# Patient Record
Sex: Female | Born: 1995 | Race: Black or African American | Hispanic: No | Marital: Single | Smoking: Current every day smoker
Health system: Southern US, Community
[De-identification: ages and names within clinical notes are randomized; demographics above are authoritative.]

## PROBLEM LIST (undated history)

## (undated) ENCOUNTER — Inpatient Hospital Stay (HOSPITAL_COMMUNITY): Payer: Self-pay

## (undated) DIAGNOSIS — Z789 Other specified health status: Secondary | ICD-10-CM

## (undated) DIAGNOSIS — B009 Herpesviral infection, unspecified: Secondary | ICD-10-CM

## (undated) HISTORY — PX: NO PAST SURGERIES: SHX2092

---
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
## 2014-03-02 ENCOUNTER — Inpatient Hospital Stay (HOSPITAL_COMMUNITY)
Admission: AD | Admit: 2014-03-02 | Discharge: 2014-03-02 | Disposition: A | Discharge status: Home / Self Care | Payer: PRIVATE HEALTH INSURANCE | Source: Ambulatory Visit | Attending: Obstetrics & Gynecology | Admitting: Obstetrics & Gynecology

## 2014-03-02 ENCOUNTER — Encounter (HOSPITAL_COMMUNITY): Payer: Self-pay | Admitting: *Deleted

## 2014-03-02 DIAGNOSIS — A499 Bacterial infection, unspecified: Secondary | ICD-10-CM | POA: Insufficient documentation

## 2014-03-02 DIAGNOSIS — B9689 Other specified bacterial agents as the cause of diseases classified elsewhere: Secondary | ICD-10-CM | POA: Insufficient documentation

## 2014-03-02 DIAGNOSIS — N760 Acute vaginitis: Secondary | ICD-10-CM | POA: Insufficient documentation

## 2014-03-02 DIAGNOSIS — T192XXA Foreign body in vulva and vagina, initial encounter: Secondary | ICD-10-CM | POA: Insufficient documentation

## 2014-03-02 DIAGNOSIS — IMO0002 Reserved for concepts with insufficient information to code with codable children: Secondary | ICD-10-CM | POA: Insufficient documentation

## 2014-03-02 HISTORY — DX: Other specified health status: Z78.9

## 2014-03-02 LAB — WET PREP, GENITAL
TRICH WET PREP: NONE SEEN
Yeast Wet Prep HPF POC: NONE SEEN

## 2014-03-02 LAB — POCT PREGNANCY, URINE: PREG TEST UR: NEGATIVE

## 2014-03-02 MED ORDER — METRONIDAZOLE 500 MG PO TABS: 500.0000 mg | ORAL_TABLET | Freq: Two times a day (BID) | ORAL | Status: DC

## 2014-03-02 NOTE — MAU Provider Note (Signed)
History     CSN: [id number]  Arrival date and time: 03/02/14 1150   First Merrin Mcvicker Initiated Contact with Patient 03/02/14 1442      Chief Complaint  Patient presents with  . Vaginal Discharge   HPI Comments: Anita Lundberg 18 y.o. G0P0 presents to MAU for STD screening and vaginal odor. No new partner x 3 years. Uses condoms 100% of time. LMP first week June.   Vaginal Discharge The patient's primary symptoms include a vaginal discharge.      Past Medical History  Diagnosis Date  . Medical history non-contributory     Past Surgical History  Procedure Laterality Date  . No past surgeries      Family History  Problem Relation Age of Onset  . Hypertension Mother   . Hypertension Father   . Allergies Sister     History  Substance Use Topics  . Smoking status: Never Smoker   . Smokeless tobacco: Never Used  . Alcohol Use: No    Allergies: No Known Allergies  No prescriptions prior to admission    Review of Systems  Constitutional: Negative.   HENT: Negative.   Eyes: Negative.   Respiratory: Negative.   Cardiovascular: Negative.   Genitourinary: Positive for vaginal discharge.       Vaginal odor and brownish discharge  Musculoskeletal: Negative.   Skin: Negative.   Neurological: Negative.   Psychiatric/Behavioral: Negative.    Physical Exam   Blood pressure 116/69, pulse 78, temperature 98.8 F (37.1 C), temperature source Oral, resp. rate 16, height 5\' 2"  (1.575 m), weight 77.565 kg (171 lb), last menstrual period 02/09/2014, SpO2 97.00%.  Physical Exam  Constitutional: She is oriented to person, place, and time. She appears well-developed and well-nourished.  HENT:  Head: Normocephalic and atraumatic.  Eyes: Conjunctivae are normal. Pupils are equal, round, and reactive to light.  Cardiovascular: Normal rate and regular rhythm.   Respiratory: Effort normal and breath sounds normal.  GI: Soft. Bowel sounds are normal.  Genitourinary:   Genital:external odor Vaginal:tampon in vagina Cervix:closed/ thick Bimanual:nontender   Musculoskeletal: Normal range of motion.  Neurological: She is alert and oriented to person, place, and time.  Skin: Skin is warm.  Psychiatric: She has a normal mood and affect. Her behavior is normal. Judgment and thought content normal.     Results for orders placed during the hospital encounter of 03/02/14 (from the past 24 hour(s))  POCT PREGNANCY, URINE     Status: None   Collection Time    03/02/14 12:49 PM      Result Value Ref Range   Preg Test, Ur NEGATIVE  NEGATIVE   Results for orders placed during the hospital encounter of 03/02/14 (from the past 24 hour(s))  POCT PREGNANCY, URINE     Status: None   Collection Time    03/02/14 12:49 PM      Result Value Ref Range   Preg Test, Ur NEGATIVE  NEGATIVE  WET PREP, GENITAL     Status: Abnormal   Collection Time    03/02/14  2:26 PM      Result Value Ref Range   Yeast Wet Prep HPF POC NONE SEEN  NONE SEEN   Trich, Wet Prep NONE SEEN  NONE SEEN   Clue Cells Wet Prep HPF POC MANY (*) NONE SEEN   WBC, Wet Prep HPF POC FEW (*) NONE SEEN     MAU Course  Procedures  MDM Wet prep and GC  Assessment and Plan  A: Foreign body in vagina Bacterial Vaginosis  P: Tampon removal Flagyl 500 mg po BID x 7 days No alcohol/ intercourse for 7 days Needs GYN care taker   Carolynn ServeBarefoot, Linda Miller 03/02/2014, 2:59 PM

## 2014-03-02 NOTE — Discharge Instructions (Signed)
Bacterial Vaginosis Bacterial vaginosis is a vaginal infection that occurs when the normal balance of bacteria in the vagina is disrupted. It results from an overgrowth of certain bacteria. This is the most common vaginal infection in women of childbearing age. Treatment is important to prevent complications, especially in pregnant women, as it can cause a premature delivery. CAUSES  Bacterial vaginosis is caused by an increase in harmful bacteria that are normally present in smaller amounts in the vagina. Several different kinds of bacteria can cause bacterial vaginosis. However, the reason that the condition develops is not fully understood. RISK FACTORS Certain activities or behaviors can put you at an increased risk of developing bacterial vaginosis, including:  Having a new sex partner or multiple sex partners.  Douching.  Using an intrauterine device (IUD) for contraception. Women do not get bacterial vaginosis from toilet seats, bedding, swimming pools, or contact with objects around them. SIGNS AND SYMPTOMS  Some women with bacterial vaginosis have no signs or symptoms. Common symptoms include:  Grey vaginal discharge.  A fishlike odor with discharge, especially after sexual intercourse.  Itching or burning of the vagina and vulva.  Burning or pain with urination. DIAGNOSIS  Your health care provider will take a medical history and examine the vagina for signs of bacterial vaginosis. A sample of vaginal fluid may be taken. Your health care provider will look at this sample under a microscope to check for bacteria and abnormal cells. A vaginal pH test may also be done.  TREATMENT  Bacterial vaginosis may be treated with antibiotic medicines. These may be given in the form of a pill or a vaginal cream. A second round of antibiotics may be prescribed if the condition comes back after treatment.  HOME CARE INSTRUCTIONS   Only take over-the-counter or prescription medicines as  directed by your health care provider.  If antibiotic medicine was prescribed, take it as directed. Make sure you finish it even if you start to feel better.  Do not have sex until treatment is completed.  Tell all sexual partners that you have a vaginal infection. They should see their health care provider and be treated if they have problems, such as a mild rash or itching.  Practice safe sex by using condoms and only having one sex partner. SEEK MEDICAL CARE IF:   Your symptoms are not improving after 3 days of treatment.  You have increased discharge or pain.  You have a fever. MAKE SURE YOU:   Understand these instructions.  Will watch your condition.  Will get help right away if you are not doing well or get worse. FOR MORE INFORMATION  Centers for Disease Control and Prevention, Division of STD Prevention: www.cdc.gov/std American Sexual Health Association (ASHA): www.ashastd.org  Document Released: 08/28/2005 Document Revised: 06/18/2013 Document Reviewed: 04/09/2013 ExitCare Patient Information 2015 [employer]. This information is not intended to replace advice given to you by your health care provider. Make sure you discuss any questions you have with your health care provider.  

## 2014-03-02 NOTE — MAU Provider Note (Signed)
Attestation of Attending Supervision of Advanced Practitioner (CNM/NP): Evaluation and management procedures were performed by the Advanced Practitioner under my supervision and collaboration. I have reviewed the Advanced Practitioner's note and chart, and I agree with the management and plan.  LEGGETT,KELLY H. 4:41 PM

## 2014-03-02 NOTE — MAU Note (Signed)
Patient states she has had a vaginal discharge with an odor for about 3 weeks.

## 2014-03-03 LAB — GC/CHLAMYDIA PROBE AMP
CT Probe RNA: NEGATIVE
GC Probe RNA: NEGATIVE

## 2014-03-09 DIAGNOSIS — Z792 Long term (current) use of antibiotics: Secondary | ICD-10-CM | POA: Insufficient documentation

## 2014-03-09 DIAGNOSIS — H9209 Otalgia, unspecified ear: Secondary | ICD-10-CM | POA: Diagnosis present

## 2014-03-09 DIAGNOSIS — H6640 Suppurative otitis media, unspecified, unspecified ear: Secondary | ICD-10-CM | POA: Diagnosis not present

## 2014-03-09 DIAGNOSIS — H6120 Impacted cerumen, unspecified ear: Secondary | ICD-10-CM | POA: Insufficient documentation

## 2014-03-10 ENCOUNTER — Encounter (HOSPITAL_COMMUNITY): Payer: Self-pay | Admitting: Emergency Medicine

## 2014-03-10 ENCOUNTER — Emergency Department (HOSPITAL_COMMUNITY)
Admission: EM | Admit: 2014-03-10 | Discharge: 2014-03-10 | Disposition: A | Discharge status: Home / Self Care | Payer: 59 | Attending: Emergency Medicine | Admitting: Emergency Medicine

## 2014-03-10 DIAGNOSIS — H6641 Suppurative otitis media, unspecified, right ear: Secondary | ICD-10-CM

## 2014-03-10 DIAGNOSIS — H6121 Impacted cerumen, right ear: Secondary | ICD-10-CM

## 2014-03-10 MED ORDER — ACETAMINOPHEN 325 MG PO TABS: 650.0000 mg | ORAL_TABLET | Freq: Once | ORAL | Status: DC

## 2014-03-10 MED ORDER — AMOXICILLIN 500 MG PO CAPS: 500.0000 mg | ORAL_CAPSULE | Freq: Three times a day (TID) | ORAL | Status: DC

## 2014-03-10 MED ORDER — DOCUSATE SODIUM 50 MG/5ML PO LIQD
50.0000 mg | Freq: Once | ORAL | Status: AC
  Administered 2014-03-10: 50 mg via OTIC
  Filled 2014-03-10: qty 10

## 2014-03-10 MED ORDER — ANTIPYRINE-BENZOCAINE 5.4-1.4 % OT SOLN
3.0000 [drp] | Freq: Once | OTIC | Status: AC
  Administered 2014-03-10: 3 [drp] via OTIC
  Filled 2014-03-10: qty 10

## 2014-03-10 NOTE — ED Provider Notes (Signed)
Medical screening examination/treatment/procedure(s) were performed by non-physician practitioner and as supervising physician I was immediately available for consultation/collaboration.   EKG Interpretation None        David Yelverton, MD 03/10/14 0552 

## 2014-03-10 NOTE — ED Notes (Signed)
Patient with right ear pain for the last three days.  Patient states she thinks her ear is swollen and the pain has gotten worse in the last three days.  Patient denies any drainage from ear.

## 2014-03-10 NOTE — Discharge Instructions (Signed)
1. Medications: amoxicillin, usual home medications 2. Treatment: rest, drink plenty of fluids,  3. Follow Up: Please followup with your primary doctor for discussion of your diagnoses and further evaluation after today's visit; if you do not have a primary care doctor use the resource guide provided to find one;     Cerumen Impaction A cerumen impaction is when the wax in your ear forms a plug. This plug usually causes reduced hearing. Sometimes it also causes an earache or dizziness. Removing a cerumen impaction can be difficult and painful. The wax sticks to the ear canal. The canal is sensitive and bleeds easily. If you try to remove a heavy wax buildup with a cotton tipped swab, you may push it in further. Irrigation with water, suction, and small ear curettes may be used to clear out the wax. If the impaction is fixed to the skin in the ear canal, ear drops may be needed for a few days to loosen the wax. People who build up a lot of wax frequently can use ear wax removal products available in your local drugstore. SEEK MEDICAL CARE IF:  You develop an earache, increased hearing loss, or marked dizziness. Document Released: 10/05/2004 Document Revised: 11/20/2011 Document Reviewed: 11/25/2009 [facility] Patient Information 2015 [place], [place]. This information is not intended to replace advice given to you by your health care provider. Make sure you discuss any questions you have with your health care provider.   Otitis Media With Effusion Otitis media with effusion is the presence of fluid in the middle ear. This is a common problem in children, which often follows ear infections. It may be present for weeks or longer after the infection. Unlike an acute ear infection, otitis media with effusion refers only to fluid behind the ear drum and not infection. Children with repeated ear and sinus infections and allergy problems are the most likely to get otitis media with effusion. CAUSES  The most  frequent cause of the fluid buildup is dysfunction of the eustachian tubes. These are the tubes that drain fluid in the ears to the to the back of the nose (nasopharynx). SYMPTOMS   The main symptom of this condition is hearing loss. As a result, you or your child may:  Listen to the TV at a loud volume.  Not respond to questions.  Ask "what" often when spoken to.  Mistake or confuse on sound or word for another.  There may be a sensation of fullness or pressure but usually not pain. DIAGNOSIS   Your health care provider will diagnose this condition by examining you or your child's ears.  Your health care provider may test the pressure in you or your child's ear with a tympanometer.  A hearing test may be conducted if the problem persists. TREATMENT   Treatment depends on the duration and the effects of the effusion.  Antibiotics, decongestants, nose drops, and cortisone-type drugs (tablets or nasal spray) may not be helpful.  Children with persistent ear effusions may have delayed language or behavioral problems. Children at risk for developmental delays in hearing, learning, and speech may require referral to a specialist earlier than children not at risk.  You or your child's health care provider may suggest a referral to an ear, nose, and throat surgeon for treatment. The following may help restore normal hearing:  Drainage of fluid.  Placement of ear tubes (tympanostomy tubes).  Removal of adenoids (adenoidectomy). HOME CARE INSTRUCTIONS   Avoid second hand smoke.  Infants who are  breast fed are less likely to have this condition.  Avoid feeding infants while laying flat.  Avoid known environmental allergens.  Avoid people who are sick. SEEK MEDICAL CARE IF:   Hearing is not better in 3 months.  Hearing is worse.  Ear pain.  Drainage from the ear.  Dizziness. MAKE SURE YOU:   Understand these instructions.  Will watch your condition.  Will get help  right away if you are not doing well or get worse. Document Released: 10/05/2004 Document Revised: 06/18/2013 Document Reviewed: 03/25/2013 [facility] Patient Information 2015 [place]Care, [place]. This information is not intended to replace advice given to you by your health care provider. Make sure you discuss any questions you have with your health care provider.

## 2014-03-10 NOTE — ED Provider Notes (Signed)
CSN: [id number]     Arrival date & time 03/09/14  2358 History   First MD Initiated Contact with Patient 03/10/14 0015     Chief Complaint  Patient presents with  . Otalgia     (Consider location/radiation/quality/duration/timing/severity/associated sxs/prior Treatment) Patient is a 18 y.o. female presenting with ear pain. The history is provided by the patient and medical records. No language interpreter was used.  Otalgia Associated symptoms: no abdominal pain, no cough, no diarrhea, no fever, no headaches, no rash and no vomiting     Andrea Lynch is a 18 y.o. female  With no major medical history presents to the Emergency Department complaining of gradual, persistent, progressively worsening right ear pain onset 3 days ago. Associated symptoms include decreased hearing in the right ear.  Nothing makes it better and nothing makes it worse.  Pt denies fever, chills, headache, rhinorrhea, sore throat, neck pain, chest pain, cough, congestion, fever, chills, nausea, vomiting syncope.  Patient also denies sick contacts.  Patient denies swimming in pools or lake water.   Past Medical History  Diagnosis Date  . Medical history non-contributory    Past Surgical History  Procedure Laterality Date  . No past surgeries     Family History  Problem Relation Age of Onset  . Hypertension Mother   . Hypertension Father   . Allergies Sister    History  Substance Use Topics  . Smoking status: Never Smoker   . Smokeless tobacco: Never Used  . Alcohol Use: No   OB History   Grav Para Term Preterm Abortions TAB SAB Ect Mult Living   0              Review of Systems  Constitutional: Negative for fever, diaphoresis, appetite change, fatigue and unexpected weight change.  HENT: Positive for ear pain. Negative for mouth sores.   Eyes: Negative for visual disturbance.  Respiratory: Negative for cough, chest tightness, shortness of breath and wheezing.   Cardiovascular: Negative for chest  pain.  Gastrointestinal: Negative for nausea, vomiting, abdominal pain, diarrhea and constipation.  Musculoskeletal: Negative for neck stiffness.  Skin: Negative for rash.  Allergic/Immunologic: Negative for immunocompromised state.  Neurological: Negative for syncope, light-headedness and headaches.  Hematological: Does not bruise/bleed easily.  Psychiatric/Behavioral: Negative for sleep disturbance. The patient is not nervous/anxious.       Allergies  Review of patient's allergies indicates no known allergies.  Home Medications   Prior to Admission medications   Medication Sig Start Date End Date Taking? Authorizing Provider  amoxicillin (AMOXIL) 500 MG capsule Take 1 capsule (500 mg total) by mouth 3 (three) times daily. 03/10/14   Hannah Muthersbaugh, PA-C  metroNIDAZOLE (FLAGYL) 500 MG tablet Take 500 mg by mouth 2 (two) times daily.    Historical Provider, MD   BP 126/79  Pulse 62  Temp(Src) 100.2 F (37.9 C) (Oral)  Resp 20  SpO2 99%  LMP 02/09/2014 Physical Exam  Constitutional: She is oriented to person, place, and time. She appears well-developed and well-nourished. No distress.  HENT:  Head: Normocephalic and atraumatic.  Right Ear: External ear normal.  Left Ear: Tympanic membrane, external ear and ear canal normal.  Nose: No mucosal edema or rhinorrhea. No epistaxis. Right sinus exhibits no maxillary sinus tenderness and no frontal sinus tenderness. Left sinus exhibits no maxillary sinus tenderness and no frontal sinus tenderness.  Mouth/Throat: Uvula is midline, oropharynx is clear and moist and mucous membranes are normal. Mucous membranes are not pale and not cyanotic.  No oropharyngeal exudate, posterior oropharyngeal edema, posterior oropharyngeal erythema or tonsillar abscesses.  Right ear cerumen impaction  Eyes: Conjunctivae are normal. Pupils are equal, round, and reactive to light.  Neck: Normal range of motion and full passive range of motion without pain.   Cardiovascular: Normal rate and intact distal pulses.   Pulmonary/Chest: Effort normal and breath sounds normal. No stridor.  Abdominal: Soft. Bowel sounds are normal. There is no tenderness.  Musculoskeletal: Normal range of motion.  Lymphadenopathy:    She has no cervical adenopathy.  Neurological: She is alert and oriented to person, place, and time.  Skin: Skin is dry. No rash noted. She is not diaphoretic.  Psychiatric: She has a normal mood and affect.    ED Course  EAR CERUMEN REMOVAL Date/Time: 03/10/2014 1:20 AM Performed by: Dierdre ForthMUTHERSBAUGH, HANNAH Authorized by: Dierdre ForthMUTHERSBAUGH, HANNAH Consent: Verbal consent obtained. Risks and benefits: risks, benefits and alternatives were discussed Consent given by: patient Patient understanding: patient states understanding of the procedure being performed Patient consent: the patient's understanding of the procedure matches consent given Procedure consent: procedure consent matches procedure scheduled Relevant documents: relevant documents present and verified Site marked: the operative site was marked Required items: required blood products, implants, devices, and special equipment available Patient identity confirmed: verbally with patient and arm band Time out: Immediately prior to procedure a "time out" was called to verify the correct patient, procedure, equipment, support staff and site/side marked as required. Local anesthetic: none Ceruminolytics applied: Ceruminolytics applied prior to the procedure. Location details: right ear Procedure type: irrigation Patient sedated: no Patient tolerance: Patient tolerated the procedure well with no immediate complications.   (including critical care time) Labs Review Labs Reviewed - No data to display  Imaging Review No results found.   EKG Interpretation None      MDM   Final diagnoses:  Cerumen impaction, right  Other suppurative otitis media of right ear   Docia Partch  presents with right otalgia.  Pt with low grade fever at triage and cerumen impaction on exam.  Likely OM of the right ear, but will remove cerumen and reassess.    1:24 AM Pt with cerumen removal of the right ear.  Re-evaluation with acute OM with effusion.  Patient presents with otalgia and exam consistent with acute otitis media. No concern for acute mastoiditis, meningitis.  No antibiotic use in the last month.  Patient discharged home with Amoxicillin.  Advised parents to call pediatrician today for follow-up.  I have also discussed reasons to return immediately to the ER.  Parent expresses understanding and agrees with plan.  I have personally reviewed patient's vitals, nursing note and any pertinent labs or imaging.  I performed an undressed physical exam.    At this time, it has been determined that no acute conditions requiring further emergency intervention. The patient/guardian have been advised of the diagnosis and plan. I reviewed all labs and imaging including any potential incidental findings. We have discussed signs and symptoms that warrant return to the ED, such as increasing fevers, headaches or other concerning symptoms.  Patient/guardian has voiced understanding and agreed to follow-up with the PCP or specialist in 5 days.  Vital signs are stable at discharge.   BP 126/79  Pulse 62  Temp(Src) 100.2 F (37.9 C) (Oral)  Resp 20  SpO2 99%  LMP 02/09/2014          Dierdre ForthHannah Muthersbaugh, PA-C 03/10/14 0126

## 2014-03-31 ENCOUNTER — Encounter (HOSPITAL_COMMUNITY): Payer: Self-pay | Admitting: Medical

## 2014-03-31 ENCOUNTER — Inpatient Hospital Stay (HOSPITAL_COMMUNITY)
Admission: AD | Admit: 2014-03-31 | Discharge: 2014-03-31 | Disposition: A | Discharge status: Home / Self Care | Payer: Medicaid Other | Source: Ambulatory Visit | Attending: Obstetrics & Gynecology | Admitting: Obstetrics & Gynecology

## 2014-03-31 DIAGNOSIS — Z3201 Encounter for pregnancy test, result positive: Secondary | ICD-10-CM | POA: Insufficient documentation

## 2014-03-31 DIAGNOSIS — Z3200 Encounter for pregnancy test, result unknown: Secondary | ICD-10-CM | POA: Diagnosis present

## 2014-03-31 LAB — POCT PREGNANCY, URINE: PREG TEST UR: POSITIVE — AB

## 2014-03-31 NOTE — Discharge Instructions (Signed)
Prenatal Care  WHAT IS PRENATAL CARE?  Prenatal care means health care during your pregnancy, before your baby is born. It is very important to take care of yourself and your baby during your pregnancy by:   Getting early prenatal care. If you know you are pregnant, or think you might be pregnant, call your health care provider as soon as possible. Schedule a visit for a prenatal exam.  Getting regular prenatal care. Follow your health care provider's schedule for blood and other necessary tests. Do not miss appointments.  Doing everything you can to keep yourself and your baby healthy during your pregnancy.  Getting complete care. Prenatal care should include evaluation of the medical, dietary, educational, psychological, and social needs of you and your significant other. The medical and genetic history of your family and the family of your baby's father should be discussed with your health care provider.  Discussing with your health care provider:  Prescription, over-the-counter, and herbal medicines that you take.  Any history of substance abuse, alcohol use, smoking, and illegal drug use.  Any history of domestic abuse and violence.  Immunizations you have received.  Your nutrition and diet.  The amount of exercise you do.  Any environmental and occupational hazards to which you are exposed.  History of sexually transmitted infections for both you and your partner.  Previous pregnancies you have had. WHY IS PRENATAL CARE SO IMPORTANT?  By regularly seeing your health care provider, you help ensure that problems can be identified early so that they can be treated as soon as possible. Other problems might be prevented. Many studies have shown that early and regular prenatal care is important for the health of mothers and their babies.  HOW CAN I TAKE CARE OF MYSELF WHILE I AM PREGNANT?  Here are ways to take care of yourself and your baby:   Start or continue taking your  multivitamin with 400 micrograms (mcg) of folic acid every day.  Get early and regular prenatal care. It is very important to see a health care provider during your pregnancy. Your health care provider will check at each visit to make sure that you and your baby are healthy. If there are any problems, action can be taken right away to help you and your baby.  Eat a healthy diet that includes:  Fruits.  Vegetables.  Foods low in saturated fat.  Whole grains.  Calcium-rich foods, such as milk, yogurt, and hard cheeses.  Drink 6-8 glasses of liquids a day.  Unless your health care provider tells you not to, try to be physically active for 30 minutes, most days of the week. If you are pressed for time, you can get your activity in through 10-minute segments, three times a day.  Do not smoke, drink alcohol, or use drugs. These can cause long-term damage to your baby. Talk with your health care provider about steps to take to stop smoking. Talk with a member of your faith community, a counselor, a trusted friend, or your health care provider if you are concerned about your alcohol or drug use.  Ask your health care provider before taking any medicine, even over-the-counter medicines. Some medicines are not safe to take during pregnancy.  Get plenty of rest and sleep.  Avoid hot tubs and saunas during pregnancy.  Do not have X-rays taken unless absolutely necessary and with the recommendation of your health care provider. A lead shield can be placed on your abdomen to protect your baby when  X-rays are taken in other parts of your body. °· Do not empty the cat litter when you are pregnant. It may contain a parasite that causes an infection called toxoplasmosis, which can cause birth defects. Also, use gloves when working in garden areas used by cats. °· Do not eat uncooked or undercooked meats or fish. °· Do not eat soft, mold-ripened cheeses (Brie, Camembert, and chevre) or soft, blue-veined  cheese (Danish blue and Roquefort). °· Stay away from toxic chemicals like: °¨ Insecticides. °¨ Solvents (some cleaners or paint thinners). °¨ Lead. °¨ Mercury. °· Sexual intercourse may continue until the end of the pregnancy, unless you have a medical problem or there is a problem with the pregnancy and your health care provider tells you not to. °· Do not wear high-heel shoes, especially during the second half of the pregnancy. You can lose your balance and fall. °· Do not take long trips, unless absolutely necessary. Be sure to see your health care provider before going on the trip. °· Do not sit in one position for more than 2 hours when on a trip. °· Take a copy of your medical records when going on a trip. Know where a hospital is located in the city you are visiting, in case of an emergency. °· Most dangerous household products will have pregnancy warnings on their labels. Ask your health care provider about products if you are unsure. °· Limit or eliminate your caffeine intake from coffee, tea, sodas, medicines, and chocolate. °· Many women continue working through pregnancy. Staying active might help you stay healthier. If you have a question about the safety or the hours you work at your particular job, talk with your health care provider. °· Get informed: °¨ Read books. °¨ Watch videos. °¨ Go to childbirth classes for you and your significant other. °¨ Talk with experienced moms. °· Ask your health care provider about childbirth education classes for you and your partner. Classes can help you and your partner prepare for the birth of your baby. °· Ask about a baby doctor (pediatrician) and methods and pain medicine for labor, delivery, and possible cesarean delivery. °HOW OFTEN SHOULD I SEE MY HEALTH CARE PROVIDER DURING PREGNANCY?  °Your health care provider will give you a schedule for your prenatal visits. You will have visits more often as you get closer to the end of your pregnancy. An average  pregnancy lasts about 40 weeks.  °A typical schedule includes visiting your health care provider:  °· About once each month during your first 6 months of pregnancy. °· Every 2 weeks during the next 2 months. °· Weekly in the last month, until the delivery date. °Your health care provider will probably want to see you more often if: °· You are older than 35 years. °· Your pregnancy is high risk because you have certain health problems or problems with the pregnancy, such as: °¨ Diabetes. °¨ High blood pressure. °¨ The baby is not growing on schedule, according to the dates of the pregnancy. °Your health care provider will do special tests to make sure you and your baby are not having any serious problems. °WHAT HAPPENS DURING PRENATAL VISITS?  °· At your first prenatal visit, your health care provider will do a physical exam and talk to you about your health history and the health history of your partner and your family. Your health care provider will be able to tell you what date to expect your baby to be born on. °· Your   first physical exam will include checks of your blood pressure, measurements of your height and weight, and an exam of your pelvic organs. Your health care provider will do a Pap test if you have not had one recently and will do cultures of your cervix to make sure there is no infection. °· At each prenatal visit, there will be tests of your blood, urine, blood pressure, weight, and the progress of the baby will be checked. °· At your later prenatal visits, your health care provider will check how you are doing and how your baby is developing. You may have a number of tests done as your pregnancy progresses. °¨ Ultrasound exams are often used to check on your baby's growth and health. °¨ You may have more urine and blood tests, as well as special tests, if needed. These may include amniocentesis to examine fluid in the pregnancy sac, stress tests to check how the baby responds to contractions, or a  biophysical profile to measure your baby's well-being. Your health care provider will explain the tests and why they are necessary. °¨ You should be tested for high blood sugar (gestational diabetes) between the 24th and 28th weeks of your pregnancy. °· You should discuss with your health care provider your plans to breastfeed or bottle-feed your baby. °· Each visit is also a chance for you to learn about staying healthy during pregnancy and to ask questions. °Document Released: 08/31/2003 Document Revised: 09/02/2013 Document Reviewed: 02/13/2013 °ExitCare® Patient Information ©2015 [employer]. This information is not intended to replace advice given to you by your health care provider. Make sure you discuss any questions you have with your health care provider. °Pregnancy, First Trimester °The first trimester is the first 3 months your baby is growing inside you. It is important to follow your doctor's instructions. °HOME CARE  °· Do not smoke. °· Do not drink alcohol. °· Only take medicine as told by your doctor. °· Exercise. °· Eat healthy foods. Eat regular, well-balanced meals. °· You can have sex (intercourse) if there are no other problems with the pregnancy. °· Things that help with morning sickness: °¨ Eat soda crackers before getting up in the morning. °¨ Eat 4 to 5 small meals rather than 3 large meals. °¨ Drink liquids between meals, not during meals. °· Go to all appointments as told. °· Take all vitamins or supplements as told by your doctor. °GET HELP RIGHT AWAY IF:  °· You develop a fever. °· You have a bad smelling fluid that is leaking from your vagina. °· There is bleeding from the vagina. °· You develop severe belly (abdominal) or back pain. °· You throw up (vomit) blood. It may look like coffee grounds. °· You lose more than 2 pounds in a week. °· You gain 5 pounds or more in a week. °· You gain more than 2 pounds in a week and you see puffiness (swelling) in your feet, ankles, or  legs. °· You have severe dizziness or pass out (faint). °· You are around people who have German measles, chickenpox, or fifth disease. °· You have a headache, watery poop (diarrhea), pain with peeing (urinating), or cannot breath right. °Document Released: 02/14/2008 Document Revised: 11/20/2011 Document Reviewed: 07/08/2013 °Exit[employer]® Patient Information ©2015 [employer]. This information is not intended to replace advice given to you by your health care provider. Make sure you discuss any questions you have with your health care provider. ° °

## 2014-03-31 NOTE — MAU Note (Signed)
LMP 02/13/2014, states she just wants a pregnancy test

## 2014-03-31 NOTE — MAU Provider Note (Signed)
Ms. Andrea Lynch is a 18 y.o. G1P0 at 5649w4d who presents to MAU today for pregnancy confirmation. The patient states +HPT. She denies abdominal pain or vaginal bleeding.   BP 127/61  Pulse 65  Temp(Src) 98.4 F (36.9 C) (Oral)  Resp 16  Ht 5\' 2"  (1.575 m)  Wt 177 lb (80.287 kg)  BMI 32.37 kg/m2  SpO2 100%  LMP 02/13/2014 GENERAL: Well-developed, well-nourished female in no acute distress.  HEENT: Normocephalic, atraumatic.   LUNGS: Effort normal HEART: Regular rate  SKIN: Warm, dry and without erythema PSYCH: Normal mood and affect  Results for orders placed during the hospital encounter of 03/31/14 (from the past 24 hour(s))  POCT PREGNANCY, URINE     Status: Abnormal   Collection Time    03/31/14  7:17 PM      Result Value Ref Range   Preg Test, Ur POSITIVE (*) NEGATIVE    A: Positive pregnancy test  P: Discharge home First trimester warning signs discussed Patient advised to start prenatal vitamins Pregnancy confirmation letter and contact information for [facility] given Patient may return to MAU as needed or if her condition were to change or worsen  Freddi StarrJulie N Ethier, PA-C  03/31/2014 7:32 PM

## 2014-04-30 ENCOUNTER — Other Ambulatory Visit (HOSPITAL_COMMUNITY): Payer: Self-pay | Admitting: Nurse Practitioner

## 2014-04-30 DIAGNOSIS — Z3682 Encounter for antenatal screening for nuchal translucency: Secondary | ICD-10-CM

## 2014-05-19 ENCOUNTER — Ambulatory Visit (HOSPITAL_COMMUNITY)
Admission: RE | Admit: 2014-05-19 | Discharge: 2014-05-19 | Disposition: A | Discharge status: Home / Self Care | Payer: Medicaid Other | Source: Ambulatory Visit | Attending: Nurse Practitioner | Admitting: Nurse Practitioner

## 2014-05-19 ENCOUNTER — Encounter (HOSPITAL_COMMUNITY): Payer: Self-pay

## 2014-05-19 ENCOUNTER — Other Ambulatory Visit: Payer: Self-pay

## 2014-05-19 DIAGNOSIS — Z36 Encounter for antenatal screening of mother: Secondary | ICD-10-CM | POA: Diagnosis present

## 2014-05-19 DIAGNOSIS — Z3682 Encounter for antenatal screening for nuchal translucency: Secondary | ICD-10-CM

## 2014-05-30 ENCOUNTER — Inpatient Hospital Stay (HOSPITAL_COMMUNITY)
Admission: AD | Admit: 2014-05-30 | Discharge: 2014-05-30 | Disposition: A | Discharge status: Home / Self Care | Payer: Medicaid Other | Source: Ambulatory Visit | Attending: Obstetrics & Gynecology | Admitting: Obstetrics & Gynecology

## 2014-05-30 ENCOUNTER — Encounter (HOSPITAL_COMMUNITY): Payer: Self-pay | Admitting: *Deleted

## 2014-05-30 DIAGNOSIS — O2342 Unspecified infection of urinary tract in pregnancy, second trimester: Secondary | ICD-10-CM

## 2014-05-30 DIAGNOSIS — O2390 Unspecified genitourinary tract infection in pregnancy, unspecified trimester: Secondary | ICD-10-CM

## 2014-05-30 DIAGNOSIS — N390 Urinary tract infection, site not specified: Secondary | ICD-10-CM | POA: Diagnosis not present

## 2014-05-30 DIAGNOSIS — R109 Unspecified abdominal pain: Secondary | ICD-10-CM | POA: Diagnosis present

## 2014-05-30 LAB — URINALYSIS, ROUTINE W REFLEX MICROSCOPIC
Bilirubin Urine: NEGATIVE
Glucose, UA: NEGATIVE mg/dL
Ketones, ur: 40 mg/dL — AB
Nitrite: NEGATIVE
Protein, ur: 100 mg/dL — AB
Specific Gravity, Urine: >1.03 — ABNORMAL HIGH (ref 1.005–1.030)
Urobilinogen, UA: 2 mg/dL — ABNORMAL HIGH (ref 0.0–1.0)
pH: 6 (ref 5.0–8.0)

## 2014-05-30 LAB — URINE MICROSCOPIC-ADD ON

## 2014-05-30 MED ORDER — NITROFURANTOIN MONOHYD MACRO 100 MG PO CAPS: 100.0000 mg | ORAL_CAPSULE | Freq: Two times a day (BID) | ORAL | Status: DC

## 2014-05-30 NOTE — MAU Provider Note (Signed)
History     CSN: [id number]  Arrival date and time: 05/30/14 1228   First Provider Initiated Contact with Patient 05/30/14 1542      Chief Complaint  Patient presents with  . Spotting    HPI Andrea Lynch is a 18 y.o. G1P0 at [redacted]w[redacted]d. She presents with onset low ML cramping and spotting with wiping at 10am. It resolved after got to the hospital 2-3 hr ago. No other change in discharge, odor or itching. Has urinary frequency, no urgency or dysuria. No GI changes. Same partner x 3 yr, he is at bedside. PNV with [facility]- nl so far, last appt last week.  OB History   Grav Para Term Preterm Abortions TAB SAB Ect Mult Living   1               Past Medical History  Diagnosis Date  . Medical history non-contributory     Past Surgical History  Procedure Laterality Date  . No past surgeries      Family History  Problem Relation Age of Onset  . Hypertension Mother   . Hypertension Father   . Allergies Sister     History  Substance Use Topics  . Smoking status: Never Smoker   . Smokeless tobacco: Never Used  . Alcohol Use: No    Allergies: No Known Allergies  Prescriptions prior to admission  Medication Sig Dispense Refill  . Prenatal Vit-Fe Fumarate-FA (PRENATAL VITAMIN PO) Take 1 tablet by mouth daily.         Review of Systems  Constitutional: Negative for fever and chills.  Gastrointestinal: Positive for abdominal pain. Negative for nausea, vomiting, diarrhea and constipation.  Genitourinary: Positive for frequency. Negative for dysuria and urgency.       Spotting   Physical Exam   Blood pressure 123/64, pulse 74, temperature 97.7 F (36.5 C), temperature source Oral, resp. rate 18, height  (1.6 m), weight 74.9 kg (165 lb 2 oz), last menstrual period 02/09/2014.  Physical Exam  Nursing note and vitals reviewed. Constitutional: She is oriented to person, place, and time. She appears well-developed and well-nourished.  GI: Soft. There is no tenderness.  +  FHR  Genitourinary:  Pelvic exam: Ext gen- nl anatomy,skin intact Vagina- scant creamy white discharge- no blood Cx- closed, firm, long Uterus- 14-16 wk size Adn- non tender  Musculoskeletal: Normal range of motion.  Neurological: She is alert and oriented to person, place, and time.  Skin: Skin is warm and dry.  Psychiatric: She has a normal mood and affect. Her behavior is normal.    MAU Course  Procedures  MDM Results for orders placed during the hospital encounter of 05/30/14 (from the past 24 hour(s))  URINALYSIS, ROUTINE W REFLEX MICROSCOPIC     Status: Abnormal   Collection Time    05/30/14  1:48 PM      Result Value Ref Range   Color, Urine YELLOW  YELLOW   APPearance HAZY (*) CLEAR   Specific Gravity, Urine >1.030 (*) 1.005 - 1.030   pH 6.0  5.0 - 8.0   Glucose, UA NEGATIVE  NEGATIVE mg/dL   Hgb urine dipstick LARGE (*) NEGATIVE   Bilirubin Urine NEGATIVE  NEGATIVE   Ketones, ur 40 (*) NEGATIVE mg/dL   Protein, ur 409 (*) NEGATIVE mg/dL   Urobilinogen, UA 2.0 (*) 0.0 - 1.0 mg/dL   Nitrite NEGATIVE  NEGATIVE   Leukocytes, UA SMALL (*) NEGATIVE  URINE MICROSCOPIC-ADD ON     Status: Abnormal  Collection Time    05/30/14  1:48 PM      Result Value Ref Range   Squamous Epithelial / LPF FEW (*) RARE   WBC, UA 21-50  <3 WBC/hpf   RBC / HPF TOO NUMEROUS TO COUNT  <3 RBC/hpf   Bacteria, UA MANY (*) RARE     Assessment and Plan  15 5/[redacted] wks EGA UTI  Rx Macrobid C&S pending Behaviors reviewed If S&S haven't resolved to call [facility] next week  Senica Crall M. 05/30/2014, 3:58 PM

## 2014-05-30 NOTE — MAU Note (Signed)
Pt states here for spotting when wiping only. Does have different feeling when voiding, however denies frequency or urgency. Denies abnormal vag d/c

## 2014-05-30 NOTE — MAU Note (Signed)
PT SAYS SHE NOTICED SPOTTING  WHEN SHE WENT TO B-ROOM-  10AM.      SAYS SHE STILL  HAS SPOTTING  WHEN SHE WIPES-  RED.   NONE IN UNDERWEAR.     GETTING PNC AT [facility]   ON Thursday.   NEXT APPOINTMENT  IS 10-15.       LAST SEX-    WED.    HAD U/S HERE ON 9-8-  ALL OK.  CRAMPING STARTED   THIS AM - SAME AS SPOTTING.

## 2014-06-25 ENCOUNTER — Other Ambulatory Visit (HOSPITAL_COMMUNITY): Payer: Self-pay | Admitting: Nurse Practitioner

## 2014-06-25 DIAGNOSIS — Z3689 Encounter for other specified antenatal screening: Secondary | ICD-10-CM

## 2014-06-30 ENCOUNTER — Ambulatory Visit (HOSPITAL_COMMUNITY)
Admission: RE | Admit: 2014-06-30 | Discharge: 2014-06-30 | Disposition: A | Discharge status: Home / Self Care | Payer: Medicaid Other | Source: Ambulatory Visit | Attending: Nurse Practitioner | Admitting: Nurse Practitioner

## 2014-06-30 DIAGNOSIS — Z3689 Encounter for other specified antenatal screening: Secondary | ICD-10-CM

## 2014-06-30 DIAGNOSIS — Z3A20 20 weeks gestation of pregnancy: Secondary | ICD-10-CM | POA: Diagnosis not present

## 2014-06-30 DIAGNOSIS — Z36 Encounter for antenatal screening of mother: Secondary | ICD-10-CM | POA: Insufficient documentation

## 2014-07-09 LAB — OB RESULTS CONSOLE HEPATITIS B SURFACE ANTIGEN: Hepatitis B Surface Ag: NEGATIVE

## 2014-07-09 LAB — OB RESULTS CONSOLE ABO/RH: RH Type: POSITIVE

## 2014-07-09 LAB — OB RESULTS CONSOLE GC/CHLAMYDIA
Chlamydia: NEGATIVE
GC PROBE AMP, GENITAL: NEGATIVE

## 2014-07-09 LAB — OB RESULTS CONSOLE ANTIBODY SCREEN: ANTIBODY SCREEN: NEGATIVE

## 2014-07-09 LAB — OB RESULTS CONSOLE RPR: RPR: NONREACTIVE

## 2014-07-09 LAB — OB RESULTS CONSOLE RUBELLA ANTIBODY, IGM: Rubella: IMMUNE

## 2014-07-13 ENCOUNTER — Encounter (HOSPITAL_COMMUNITY): Payer: Self-pay | Admitting: *Deleted

## 2014-07-30 LAB — OB RESULTS CONSOLE HIV ANTIBODY (ROUTINE TESTING): HIV: NONREACTIVE

## 2014-09-11 NOTE — L&D Delivery Note (Signed)
Delivery Note Patient was noted to have a BBOW at the introitus.  ROM was performed and she was noted to be 10/10/+2.  She pushed well for 15 minutes and at 12:38 AM a viable female was delivered via Vaginal, Spontaneous Delivery (Presentation: Right Occiput Anterior).  APGAR: 8, 9; weight  pending.   Placenta status: Intact, Spontaneous.  Cord: 3 vessels with the following complications: None.  Cord pH: n/a  Anesthesia: Epidural  Episiotomy: None Lacerations:  Left labial laceration, hemostatic Suture Repair: n/1 Est. Blood Loss (mL):  200 cc  Mom to postpartum.  Baby to Couplet care / Skin to Skin.  Kora Groom GEFFEL Ruffin Lada 11/19/2014, 12:59 AM

## 2014-09-24 ENCOUNTER — Other Ambulatory Visit: Payer: Self-pay | Admitting: Family Medicine

## 2014-09-24 ENCOUNTER — Encounter (HOSPITAL_COMMUNITY): Payer: Self-pay | Admitting: *Deleted

## 2014-09-24 ENCOUNTER — Inpatient Hospital Stay (HOSPITAL_COMMUNITY)
Admission: AD | Admit: 2014-09-24 | Discharge: 2014-09-24 | Disposition: A | Discharge status: Home / Self Care | Payer: Medicaid Other | Source: Ambulatory Visit | Attending: Obstetrics and Gynecology | Admitting: Obstetrics and Gynecology

## 2014-09-24 DIAGNOSIS — N6489 Other specified disorders of breast: Secondary | ICD-10-CM | POA: Diagnosis not present

## 2014-09-24 DIAGNOSIS — O9989 Other specified diseases and conditions complicating pregnancy, childbirth and the puerperium: Secondary | ICD-10-CM | POA: Diagnosis not present

## 2014-09-24 DIAGNOSIS — N6452 Nipple discharge: Secondary | ICD-10-CM

## 2014-09-24 DIAGNOSIS — Z3A32 32 weeks gestation of pregnancy: Secondary | ICD-10-CM | POA: Diagnosis not present

## 2014-09-24 DIAGNOSIS — N6459 Other signs and symptoms in breast: Secondary | ICD-10-CM

## 2014-09-24 NOTE — MAU Note (Signed)
Pt reports blood leaking from her left breast/nipple x 2 days.

## 2014-09-24 NOTE — Discharge Instructions (Signed)
The cause of your bleeding is likely related to pregnancy-related changes, though this is not certain. You should be contacted soon by the breast center to schedule a diagnostic left breast ultrasound to further investigate this. Please also follow up with the women's clinic for routine prenatal care.   If you have regular contractions that are 3-5 minutes apart for at least one hour, bleeding or fluid from your vagina, or you do not feel the baby moving at least 10 times every 2 hours, please go to the [facility].   Return to [facility] if your bleeding increases or if you experience fever.

## 2014-09-24 NOTE — MAU Provider Note (Signed)
Chief Complaint:  No chief complaint on file.  HPI: Andrea Lynch is a 19 y.o. G1P0 at 506w3d who presents to maternity admissions reporting spontaneous bleeding out of left nipple.  For the past 2 days she has noted red discharge on her bra overlying the left nipple. This discharge is painless and not constant, though she denies elicitation by manual expression or tight fitting clothing. Denies other discharge in the left breast or any abnormalities with right breast. No history of breast disease. No FH breast CA. Denies fevers, chills, weight loss, mass on self exam.  Denies contractions, leakage of fluid or vaginal bleeding. Good fetal movement.   Past Medical/Surgical History: None; no breast surgeries.  Past Obstetric History: G1  Family History: Problem Relation  . Hypertension Mother  . Hypertension Father  . Allergies Sister   Social History: Substance Use Topics  . Smoking status: Never Smoker   . Smokeless tobacco: Never Used  . Alcohol Use: No   Allergies: No Known Allergies  Meds:  Medication Sig Dispense  . Prenatal Vit-Fe Fumarate-FA (PRENATAL VITAMIN PO) Take 1 tablet by mouth daily.    . nitrofurantoin, macrocrystal-monohydrate, (MACROBID) 100 MG capsule Take 1 capsule (100 mg total) by mouth 2 (two) times daily. 14 capsule   ROS: Per HPI  Physical Exam: Blood pressure 100/62, pulse 81, temperature 98.5 F (36.9 C), temperature source Oral, resp. rate 18, height 5\' 2"  (1.575 m), weight 79.833 kg (176 lb), last menstrual period 02/09/2014, SpO2 100 %. GENERAL: Well-appearing, well-nourished female in no distress.  HEENT: Normocephalic, atraumatic HEART: Normal rate RESP: Normal effort ABDOMEN: Soft, non-tender, gravid uterus consistent with dates. EXTREMITIES: No edema NEURO: alert and oriented, DTRs 2+   FHT:  Baseline 140, moderate variability, accelerations present, no decelerations Toco: Quiet   ED Course No abnormalities on CBE, not currently  symptomatic.  Likely capillary leak due to pregnancy-related discharge/manual expression.  Assessment: 1. Bleeding from nipple in female   SIUP at 606w3d   Plan: - Discharge home - Follow up at breast center for diagnostic left breast ultrasound  - Labor precautions and fetal kick counts Follow-up Information    [facility]. Call today.   Specialty:  Diagnostic Radiology   Contact information:   [address] [place] [address] [phone]        Medication List    TAKE these medications        nitrofurantoin (macrocrystal-monohydrate) 100 MG capsule  Commonly known as:  MACROBID  Take 1 capsule (100 mg total) by mouth 2 (two) times daily.     PRENATAL VITAMIN PO  Take 1 tablet by mouth daily.        Ryan B. Jarvis NewcomerGrunz, MD, PGY-2 09/24/2014 2:45 AM   I have participated in the care of this patient and I agree with the above. Cam HaiSHAW, Jiya Kissinger CNM 9:47 AM 09/24/2014

## 2014-09-25 ENCOUNTER — Other Ambulatory Visit: Payer: Self-pay

## 2014-09-25 ENCOUNTER — Other Ambulatory Visit: Payer: Self-pay | Admitting: Family Medicine

## 2014-09-25 ENCOUNTER — Other Ambulatory Visit: Payer: Self-pay | Admitting: Obstetrics

## 2014-09-25 DIAGNOSIS — N6452 Nipple discharge: Secondary | ICD-10-CM

## 2014-09-28 ENCOUNTER — Other Ambulatory Visit: Payer: Self-pay | Admitting: Obstetrics

## 2014-09-28 DIAGNOSIS — N6452 Nipple discharge: Secondary | ICD-10-CM

## 2014-09-30 ENCOUNTER — Ambulatory Visit
Admission: RE | Admit: 2014-09-30 | Discharge: 2014-09-30 | Disposition: A | Discharge status: Home / Self Care | Payer: Medicaid Other | Source: Ambulatory Visit | Attending: Obstetrics and Gynecology | Admitting: Obstetrics and Gynecology

## 2014-09-30 ENCOUNTER — Ambulatory Visit
Admission: RE | Admit: 2014-09-30 | Discharge: 2014-09-30 | Disposition: A | Discharge status: Home / Self Care | Payer: Medicaid Other | Source: Ambulatory Visit | Attending: Obstetrics | Admitting: Obstetrics

## 2014-09-30 DIAGNOSIS — N6452 Nipple discharge: Secondary | ICD-10-CM

## 2014-10-13 ENCOUNTER — Other Ambulatory Visit: Payer: Self-pay | Admitting: Obstetrics and Gynecology

## 2014-10-13 LAB — OB RESULTS CONSOLE GBS: GBS: NEGATIVE

## 2014-11-18 ENCOUNTER — Encounter (HOSPITAL_COMMUNITY): Payer: Self-pay | Admitting: *Deleted

## 2014-11-18 ENCOUNTER — Inpatient Hospital Stay (HOSPITAL_COMMUNITY): Payer: Medicaid Other | Admitting: Anesthesiology

## 2014-11-18 ENCOUNTER — Inpatient Hospital Stay (HOSPITAL_COMMUNITY)
Admission: AD | Admit: 2014-11-18 | Discharge: 2014-11-21 | DRG: 775 | Disposition: A | Discharge status: Home / Self Care | Payer: Medicaid Other | Source: Ambulatory Visit | Attending: Obstetrics | Admitting: Obstetrics

## 2014-11-18 DIAGNOSIS — O480 Post-term pregnancy: Secondary | ICD-10-CM | POA: Diagnosis present

## 2014-11-18 DIAGNOSIS — O288 Other abnormal findings on antenatal screening of mother: Secondary | ICD-10-CM | POA: Diagnosis present

## 2014-11-18 DIAGNOSIS — Z3A40 40 weeks gestation of pregnancy: Secondary | ICD-10-CM | POA: Diagnosis present

## 2014-11-18 DIAGNOSIS — IMO0001 Reserved for inherently not codable concepts without codable children: Secondary | ICD-10-CM

## 2014-11-18 LAB — CBC
HCT: 30.6 % — ABNORMAL LOW (ref 36.0–46.0)
Hemoglobin: 9.9 g/dL — ABNORMAL LOW (ref 12.0–15.0)
MCH: 27.3 pg (ref 26.0–34.0)
MCHC: 32.4 g/dL (ref 30.0–36.0)
MCV: 84.3 fL (ref 78.0–100.0)
PLATELETS: 334 10*3/uL (ref 150–400)
RBC: 3.63 MIL/uL — ABNORMAL LOW (ref 3.87–5.11)
RDW: 15.1 % (ref 11.5–15.5)
WBC: 12.9 10*3/uL — ABNORMAL HIGH (ref 4.0–10.5)

## 2014-11-18 LAB — TYPE AND SCREEN
ABO/RH(D): O POS
Antibody Screen: NEGATIVE

## 2014-11-18 MED ORDER — TERBUTALINE SULFATE 1 MG/ML IJ SOLN: 0.2500 mg | Freq: Once | INTRAMUSCULAR | Status: AC | PRN

## 2014-11-18 MED ORDER — LIDOCAINE HCL (PF) 1 % IJ SOLN
30.0000 mL | INTRAMUSCULAR | Status: DC | PRN
  Filled 2014-11-18: qty 30

## 2014-11-18 MED ORDER — LACTATED RINGERS IV SOLN: 500.0000 mL | Freq: Once | INTRAVENOUS | Status: DC

## 2014-11-18 MED ORDER — FENTANYL 2.5 MCG/ML BUPIVACAINE 1/10 % EPIDURAL INFUSION (WH - ANES): 14.0000 mL/h | INTRAMUSCULAR | Status: DC | PRN

## 2014-11-18 MED ORDER — OXYTOCIN 40 UNITS IN LACTATED RINGERS INFUSION - SIMPLE MED
1.0000 m[IU]/min | INTRAVENOUS | Status: DC
  Administered 2014-11-18: 2 m[IU]/min via INTRAVENOUS
  Filled 2014-11-18: qty 1000

## 2014-11-18 MED ORDER — LACTATED RINGERS IV SOLN
INTRAVENOUS | Status: DC
  Administered 2014-11-18: 20:00:00 via INTRAVENOUS

## 2014-11-18 MED ORDER — OXYCODONE-ACETAMINOPHEN 5-325 MG PO TABS: 1.0000 | ORAL_TABLET | ORAL | Status: DC | PRN

## 2014-11-18 MED ORDER — EPHEDRINE 5 MG/ML INJ: 10.0000 mg | INTRAVENOUS | Status: DC | PRN

## 2014-11-18 MED ORDER — EPHEDRINE 5 MG/ML INJ
10.0000 mg | INTRAVENOUS | Status: DC | PRN
  Filled 2014-11-18: qty 2

## 2014-11-18 MED ORDER — ACETAMINOPHEN 325 MG PO TABS: 650.0000 mg | ORAL_TABLET | ORAL | Status: DC | PRN

## 2014-11-18 MED ORDER — DIPHENHYDRAMINE HCL 50 MG/ML IJ SOLN: 12.5000 mg | INTRAMUSCULAR | Status: DC | PRN

## 2014-11-18 MED ORDER — PHENYLEPHRINE 40 MCG/ML (10ML) SYRINGE FOR IV PUSH (FOR BLOOD PRESSURE SUPPORT)
80.0000 ug | PREFILLED_SYRINGE | INTRAVENOUS | Status: DC | PRN
  Filled 2014-11-18: qty 2
  Filled 2014-11-18: qty 20

## 2014-11-18 MED ORDER — ONDANSETRON HCL 4 MG/2ML IJ SOLN: 4.0000 mg | Freq: Four times a day (QID) | INTRAMUSCULAR | Status: DC | PRN

## 2014-11-18 MED ORDER — PHENYLEPHRINE 40 MCG/ML (10ML) SYRINGE FOR IV PUSH (FOR BLOOD PRESSURE SUPPORT): 80.0000 ug | PREFILLED_SYRINGE | INTRAVENOUS | Status: DC | PRN

## 2014-11-18 MED ORDER — FENTANYL 2.5 MCG/ML BUPIVACAINE 1/10 % EPIDURAL INFUSION (WH - ANES)
INTRAMUSCULAR | Status: DC | PRN
  Administered 2014-11-18: 14 mL/h via EPIDURAL

## 2014-11-18 MED ORDER — MISOPROSTOL 25 MCG QUARTER TABLET
25.0000 ug | ORAL_TABLET | ORAL | Status: DC | PRN
  Administered 2014-11-18: 25 ug via VAGINAL
  Filled 2014-11-18: qty 0.25
  Filled 2014-11-18: qty 1

## 2014-11-18 MED ORDER — PHENYLEPHRINE 40 MCG/ML (10ML) SYRINGE FOR IV PUSH (FOR BLOOD PRESSURE SUPPORT)
80.0000 ug | PREFILLED_SYRINGE | INTRAVENOUS | Status: DC | PRN
  Filled 2014-11-18: qty 2

## 2014-11-18 MED ORDER — LIDOCAINE HCL (PF) 1 % IJ SOLN
INTRAMUSCULAR | Status: DC | PRN
  Administered 2014-11-18 (×2): 4 mL

## 2014-11-18 MED ORDER — OXYCODONE-ACETAMINOPHEN 5-325 MG PO TABS: 2.0000 | ORAL_TABLET | ORAL | Status: DC | PRN

## 2014-11-18 MED ORDER — BUTORPHANOL TARTRATE 1 MG/ML IJ SOLN: 1.0000 mg | INTRAMUSCULAR | Status: DC | PRN

## 2014-11-18 MED ORDER — OXYTOCIN BOLUS FROM INFUSION: 500.0000 mL | INTRAVENOUS | Status: DC

## 2014-11-18 MED ORDER — CITRIC ACID-SODIUM CITRATE 334-500 MG/5ML PO SOLN: 30.0000 mL | ORAL | Status: DC | PRN

## 2014-11-18 MED ORDER — FENTANYL 2.5 MCG/ML BUPIVACAINE 1/10 % EPIDURAL INFUSION (WH - ANES)
14.0000 mL/h | INTRAMUSCULAR | Status: DC | PRN
  Administered 2014-11-18: 14 mL/h via EPIDURAL
  Filled 2014-11-18: qty 125

## 2014-11-18 MED ORDER — LACTATED RINGERS IV SOLN: 500.0000 mL | INTRAVENOUS | Status: DC | PRN

## 2014-11-18 MED ORDER — LACTATED RINGERS IV SOLN
500.0000 mL | Freq: Once | INTRAVENOUS | Status: AC
  Administered 2014-11-18: 500 mL via INTRAVENOUS

## 2014-11-18 MED ORDER — OXYTOCIN 40 UNITS IN LACTATED RINGERS INFUSION - SIMPLE MED
62.5000 mL/h | INTRAVENOUS | Status: DC
  Filled 2014-11-18: qty 1000

## 2014-11-18 NOTE — Anesthesia Procedure Notes (Signed)
Epidural Patient location during procedure: OB Start time: 11/18/2014 8:03 PM  Staffing Anesthesiologist: Karie SchwalbeJUDD, Aleyda Gindlesperger Performed by: anesthesiologist   Preanesthetic Checklist Completed: patient identified, site marked, surgical consent, pre-op evaluation, timeout performed, IV checked, risks and benefits discussed and monitors and equipment checked  Epidural Patient position: sitting Prep: site prepped and draped and DuraPrep Patient monitoring: continuous pulse ox and blood pressure Approach: midline Location: L3-L4 Injection technique: LOR saline  Needle:  Needle type: Tuohy  Needle gauge: 17 G Needle length: 9 cm and 9 Needle insertion depth: 5 cm cm Catheter type: closed end flexible Catheter size: 19 Gauge Catheter at skin depth: 11 cm Test dose: negative  Assessment Events: blood not aspirated, injection not painful, no injection resistance, negative IV test and no paresthesia  Additional Notes Patient identified. Risks/Benefits/Options discussed with patient including but not limited to bleeding, infection, nerve damage, paralysis, failed block, incomplete pain control, headache, blood pressure changes, nausea, vomiting, reactions to medication both or allergic, itching and postpartum back pain. Confirmed with bedside nurse the patient's most recent platelet count. Confirmed with patient that they are not currently taking any anticoagulation, have any bleeding history or any family history of bleeding disorders. Patient expressed understanding and wished to proceed. All questions were answered. Sterile technique was used throughout the entire procedure. Please see nursing notes for vital signs. Test dose was given through epidural catheter and negative prior to continuing to dose epidural or start infusion. Warning signs of high block given to the patient including shortness of breath, tingling/numbness in hands, complete motor block, or any concerning symptoms with instructions  to call for help. Patient was given instructions on fall risk and not to get out of bed. All questions and concerns addressed with instructions to call with any issues or inadequate analgesia.

## 2014-11-18 NOTE — Anesthesia Preprocedure Evaluation (Signed)
Anesthesia Evaluation  Patient identified by MRN, date of birth, ID band Patient awake    Reviewed: Allergy & Precautions, NPO status , Patient's Chart, lab work & pertinent test results  History of Anesthesia Complications Negative for: history of anesthetic complications  Airway Mallampati: II  TM Distance: >3 FB Neck ROM: Full    Dental no notable dental hx. (+) Dental Advisory Given   Pulmonary neg pulmonary ROS,  breath sounds clear to auscultation  Pulmonary exam normal       Cardiovascular negative cardio ROS  Rhythm:Regular Rate:Normal     Neuro/Psych negative neurological ROS  negative psych ROS   GI/Hepatic negative GI ROS, Neg liver ROS,   Endo/Other  obesity  Renal/GU negative Renal ROS  negative genitourinary   Musculoskeletal negative musculoskeletal ROS (+)   Abdominal   Peds negative pediatric ROS (+)  Hematology negative hematology ROS (+)   Anesthesia Other Findings   Reproductive/Obstetrics (+) Pregnancy                             Anesthesia Physical Anesthesia Plan  ASA: II  Anesthesia Plan: Epidural   Post-op Pain Management:    Induction:   Airway Management Planned:   Additional Equipment:   Intra-op Plan:   Post-operative Plan:   Informed Consent: I have reviewed the patients History and Physical, chart, labs and discussed the procedure including the risks, benefits and alternatives for the proposed anesthesia with the patient or authorized representative who has indicated his/her understanding and acceptance.   Dental advisory given  Plan Discussed with:   Anesthesia Plan Comments:         Anesthesia Quick Evaluation  

## 2014-11-18 NOTE — H&P (Signed)
19 y.o. G1P0 @ 4874w2d presents from the office for IOL.  She had a routine NST due to GA>40 and was noted to have minimal variability w variable decels w contractions.  Reports minimal pain w contractions.  Otherwise has good fetal movement and no bleeding.  Past Medical History  Diagnosis Date  . Medical history non-contributory     Past Surgical History  Procedure Laterality Date  . No past surgeries      OB History  Gravida Para Term Preterm AB SAB TAB Ectopic Multiple Living  1             # Outcome Date GA Lbr Len/2nd Weight Sex Delivery Anes PTL Lv  1 Current               History   Social History  . Marital Status: Single    Spouse Name: n/a  . Number of Children: 0  . Years of Education: N/A   Occupational History  . cashier/dining room/expedite [employer]  . student     [place]   Social History Main Topics  . Smoking status: Never Smoker   . Smokeless tobacco: Never Used  . Alcohol Use: No  . Drug Use: No  . Sexual Activity: Yes    Birth Control/ Protection: None   Other Topics Concern  . Not on file   Social History Narrative   Lives with her father and his wife.    Review of patient's allergies indicates no known allergies.    Prenatal Transfer Tool  Maternal Diabetes: No Genetic Screening: Normal Maternal Ultrasounds/Referrals: Normal Fetal Ultrasounds or other Referrals:  None Maternal Substance Abuse:  No Significant Maternal Medications:  None Significant Maternal Lab Results: Lab values include: Group B Strep negative  ABO, Rh: O/Positive/-- (10/29 0000) Antibody: Negative (10/29 0000) Rubella:   Immune RPR: Nonreactive (10/29 0000)  HBsAg: Negative (10/29 0000)  HIV: Non-reactive (11/19 0000)  GBS: Negative (02/02 0000)    Other PNC: uncomplicated.    Filed Vitals:   11/18/14 1449  BP: 110/70  Pulse: 85  Resp: 18     General:  NAD Lungs: CTAB Cardiac: RRR Abdomen:  soft, gravid, EFW 7# Ex:  no edema SVE:   1/50/soft/posterior FHTs:   In office: 140s, minimal variability with variable decels with contractions, cat 2 ON arrival to L&D, now 130s, mod var + accels, Cat 1 Toco:  q3-7 min   A/P   19 y.o. 4974w2d  G1P0 presents with IOL for equivocal fetal testing at term As EFM is now reactive, will plan cytotec IOL. Epidural upon maternal request   FSR/ vtx/ GBS neg  Janyia Guion GEFFEL Schylar Wuebker

## 2014-11-19 ENCOUNTER — Encounter (HOSPITAL_COMMUNITY): Payer: Self-pay | Admitting: *Deleted

## 2014-11-19 LAB — CBC
HCT: 27.6 % — ABNORMAL LOW (ref 36.0–46.0)
HEMOGLOBIN: 9 g/dL — AB (ref 12.0–15.0)
MCH: 27.4 pg (ref 26.0–34.0)
MCHC: 32.6 g/dL (ref 30.0–36.0)
MCV: 83.9 fL (ref 78.0–100.0)
PLATELETS: 259 10*3/uL (ref 150–400)
RBC: 3.29 MIL/uL — AB (ref 3.87–5.11)
RDW: 15.1 % (ref 11.5–15.5)
WBC: 14.5 10*3/uL — ABNORMAL HIGH (ref 4.0–10.5)

## 2014-11-19 LAB — ABO/RH: ABO/RH(D): O POS

## 2014-11-19 LAB — RPR: RPR Ser Ql: NONREACTIVE

## 2014-11-19 MED ORDER — PRENATAL MULTIVITAMIN CH
1.0000 | ORAL_TABLET | Freq: Every day | ORAL | Status: DC
  Administered 2014-11-19 – 2014-11-20 (×2): 1 via ORAL
  Filled 2014-11-19 (×2): qty 1

## 2014-11-19 MED ORDER — LANOLIN HYDROUS EX OINT: TOPICAL_OINTMENT | CUTANEOUS | Status: DC | PRN

## 2014-11-19 MED ORDER — WITCH HAZEL-GLYCERIN EX PADS: 1.0000 "application " | MEDICATED_PAD | CUTANEOUS | Status: DC | PRN

## 2014-11-19 MED ORDER — ONDANSETRON HCL 4 MG PO TABS: 4.0000 mg | ORAL_TABLET | ORAL | Status: DC | PRN

## 2014-11-19 MED ORDER — BENZOCAINE-MENTHOL 20-0.5 % EX AERO
1.0000 "application " | INHALATION_SPRAY | CUTANEOUS | Status: DC | PRN
  Administered 2014-11-19: 1 via TOPICAL
  Filled 2014-11-19: qty 56

## 2014-11-19 MED ORDER — DIBUCAINE 1 % RE OINT: 1.0000 "application " | TOPICAL_OINTMENT | RECTAL | Status: DC | PRN

## 2014-11-19 MED ORDER — OXYCODONE-ACETAMINOPHEN 5-325 MG PO TABS
1.0000 | ORAL_TABLET | ORAL | Status: DC | PRN
  Administered 2014-11-19: 1 via ORAL
  Filled 2014-11-19: qty 1

## 2014-11-19 MED ORDER — SENNOSIDES-DOCUSATE SODIUM 8.6-50 MG PO TABS
2.0000 | ORAL_TABLET | ORAL | Status: DC
  Administered 2014-11-20: 2 via ORAL
  Filled 2014-11-19 (×2): qty 2

## 2014-11-19 MED ORDER — IBUPROFEN 600 MG PO TABS
600.0000 mg | ORAL_TABLET | Freq: Four times a day (QID) | ORAL | Status: DC
  Administered 2014-11-19 – 2014-11-21 (×9): 600 mg via ORAL
  Filled 2014-11-19 (×9): qty 1

## 2014-11-19 MED ORDER — SIMETHICONE 80 MG PO CHEW: 80.0000 mg | CHEWABLE_TABLET | ORAL | Status: DC | PRN

## 2014-11-19 MED ORDER — DIPHENHYDRAMINE HCL 25 MG PO CAPS
25.0000 mg | ORAL_CAPSULE | Freq: Four times a day (QID) | ORAL | Status: DC | PRN
Start: 2014-11-19 — End: 2014-11-21

## 2014-11-19 MED ORDER — ONDANSETRON HCL 4 MG/2ML IJ SOLN: 4.0000 mg | INTRAMUSCULAR | Status: DC | PRN

## 2014-11-19 MED ORDER — TETANUS-DIPHTH-ACELL PERTUSSIS 5-2.5-18.5 LF-MCG/0.5 IM SUSP: 0.5000 mL | Freq: Once | INTRAMUSCULAR | Status: DC

## 2014-11-19 NOTE — Progress Notes (Signed)
Post Partum Day 0 Subjective: no complaints, up ad lib, voiding and tolerating PO  Objective: Blood pressure 103/52, pulse 71, temperature 100.1 F (37.8 C), temperature source Oral, resp. rate 20, height 5\' 2"  (1.575 m), weight 82.101 kg (181 lb), last menstrual period 02/09/2014, SpO2 100 %, unknown if currently breastfeeding.  Physical Exam:  General: alert, cooperative and appears stated age Lochia: appropriate Uterine Fundus: firm  Recent Labs  11/18/14 1500 11/19/14 0645  HGB 9.9* 9.0*  HCT 30.6* 27.6*    Assessment/Plan: Routine PP care Neonatal circ in office   LOS: 1 day   Zaron Zwiefelhofer H. 11/19/2014, 9:24 AM

## 2014-11-19 NOTE — Anesthesia Postprocedure Evaluation (Signed)
  Anesthesia Post-op Note  Patient: Andrea Lynch  Procedure(s) Performed: * No procedures listed *  Patient Location: PACU and Mother/Baby  Anesthesia Type:Epidural  Level of Consciousness: awake, alert  and oriented  Airway and Oxygen Therapy: Patient Spontanous Breathing  Post-op Pain: mild  Post-op Assessment: Post-op Vital signs reviewed, Patient's Cardiovascular Status Stable, Respiratory Function Stable, No signs of Nausea or vomiting, Adequate PO intake, Pain level controlled, No headache, No backache, No residual numbness and No residual motor weakness  Post-op Vital Signs: Reviewed and stable  Last Vitals:  Filed Vitals:   11/19/14 0800  BP: 103/52  Pulse: 71  Temp: 37.8 C  Resp: 20    Complications: No apparent anesthesia complications

## 2014-11-19 NOTE — Anesthesia Postprocedure Evaluation (Signed)
  Anesthesia Post-op Note  Patient: Andrea Lynch  Procedure(s) Performed: * No procedures listed *  Patient Location: PACU and Mother/Baby  Anesthesia Type:Epidural  Level of Consciousness: awake, alert  and oriented  Airway and Oxygen Therapy: Patient Spontanous Breathing  Post-op Pain: mild  Post-op Assessment: Post-op Vital signs reviewed, Patient's Cardiovascular Status Stable, Respiratory Function Stable, No signs of Nausea or vomiting, Adequate PO intake, Pain level controlled, No headache, No backache, No residual numbness and No residual motor weakness  Post-op Vital Signs: Reviewed and stable  Last Vitals:  Filed Vitals:   11/19/14 0800  BP: 103/52  Pulse: 71  Temp: 37.8 C  Resp: 20    Complications: No apparent anesthesia complications 

## 2014-11-19 NOTE — Lactation Note (Addendum)
This note was copied from the chart of Andrea Dhruti Nelligan. Lactation Consultation Note  Patient Name: Andrea Lynch ZOXWR'UToday's Date: 11/19/2014 Reason for consult: Follow-up assessment;Difficult latch   Follow-up with mom at 15 hrs old.  Infant was undressed and put STS with mom when he began to show feeding cues.  Infant had difficulty latching to right breast in football hold d/t large inverted nipple.  Mom hand expressed some brownish colored colostrum (noted earlier in day) and slightly brownish colored at current visit which we dropped into infant's mouth.  Infant took a few sucks but could not maintain latch.  Mom consented to trying nipple shield.  LC showed mom how to apply #24 nipple shield.  Infant easily latched with wide gape, flanged lips, and depth encompassing most of nipple shield.  Taught mom how to sandwich breast and use asymmetrical latching technique; taught dad how to assist with latching by using the teacup hold.  Infant stayed in a consistent sucking pattern with minimal stimulation needed during long pauses to get him sucking again; few swallows heard.  LS-6 d/t few swallows, inverted nipple, and assistance/ teaching given for positioning.  Infant fed for 30 minutes and then came off satisfied, nipple shield was moistened with clear colostrum noted, mom's nipple was erect at end of feeding.  Mom denied any pain during feeding and stated she felt the tugging "somewhat".  Discussed with parents how to use nipple shield for initial latching (nipple shield info sheet given) and discussed removing nipple shield after a few minutes once nipple is erect for continuation of feeding without shield.  Spoons, curved-tip syringe, and colostrum collection container given for EBM supplementation or enticing to feed as needed.  Encouraged to feed with cues, but if it has been several hours since last feeding then undress infant, put STS, and use EBM to entice to latch. Reviewed spoon feeding, taught  how to load nipple shield with colostrum for easier latching, and how to feed any EBM collected back to baby at breast.  Mom has hand pump, increased flange size for comfort; gave both #27 & #30 to try explaining how to use appropriately with hand pump.   Encouraged to call for assistance as needed.  Discussed consult with oncoming RN at bedside.   Maternal Data Formula Feeding for Exclusion: Yes Reason for exclusion: Mother's choice to formula and breast feed on admission Has patient been taught Hand Expression?: Yes Does the patient have breastfeeding experience prior to this delivery?: No  Feeding Feeding Type: Breast Fed Length of feed: 0 min  LATCH Score/Interventions Latch: Grasps breast easily, tongue down, lips flanged, rhythmical sucking. Intervention(s): Skin to skin;Teach feeding cues Intervention(s): Breast compression  Audible Swallowing: A few with stimulation Intervention(s): Hand expression  Type of Nipple: Inverted  Comfort (Breast/Nipple): Soft / non-tender     Hold (Positioning): Assistance needed to correctly position infant at breast and maintain latch. Intervention(s): Support Pillows;Breastfeeding basics reviewed;Skin to skin  LATCH Score: 6  Lactation Tools Discussed/Used Tools: Nipple Shields Nipple shield size: 24 WIC Program: Yes Initiated by:: RN   Consult Status Consult Status: Follow-up Date: 11/20/14 Follow-up type: In-patient    [name], Mackensey Bolte Walker 11/19/2014, 3:46 PM

## 2014-11-19 NOTE — Lactation Note (Signed)
This note was copied from the chart of Andrea Lynch. Lactation Consultation Note  Patient Name: Andrea Samuel JesterDasjie Hattery NWGNF'AToday's Date: 11/19/2014 Reason for consult: Initial assessment   Initial consult at 1611 hours old; GA 40.3; BW 7lbs, 2.3 oz; Mom is a P1.  Infant has breastfed x2 (30-60 min- mom reports not consistent sucking pattern) + EBM x2 (.5-1 ml) + attempts x2 (few sucks); voids-0; stools-1.  Infant was lying in crib asleep upon entering room not cueing; 3 hrs since last feeding.  Put infant STS with mom and attempted to latch but infant was too sleepy for latching.  Reviewed hand expression and colostrum easily expressed; ~.5 ml colostrum.  Several drops of colostrum dropped into infant's mouth to try to entice him to latch, but he only swallowed and remained asleep.  Mom has inverted nipples and reports having difficulty latching infant or keeping infant latched and sucking.  Instructed mom to call LC when baby begins to cue so LC can assist with latching.  Mom may need to be fit for a nipple shield for latching and to help sustain latch.   Lactation brochure given and informed of hospital support group and outpatient services.       Maternal Data Has patient been taught Hand Expression?: Yes Does the patient have breastfeeding experience prior to this delivery?: No  Feeding Feeding Type: Breast Milk Length of feed: 0 min  LATCH Score/Interventions Latch: Too sleepy or reluctant, no latch achieved, no sucking elicited. Intervention(s): Skin to skin;Teach feeding cues  Audible Swallowing: A few with stimulation Intervention(s): Hand expression Intervention(s): Hand expression  Type of Nipple: Inverted Intervention(s): Hand pump  Comfort (Breast/Nipple): Soft / non-tender     Hold (Positioning): Assistance needed to correctly position infant at breast and maintain latch.  LATCH Score: 6  Lactation Tools Discussed/Used WIC Program: Yes Initiated by:: RN   Consult  Status Consult Status: Follow-up Date: 11/19/14 Follow-up type: In-patient    [name], [name] 11/19/2014, 12:19 PM

## 2014-11-19 NOTE — Progress Notes (Signed)
UR chart review completed.  

## 2014-11-20 DIAGNOSIS — IMO0001 Reserved for inherently not codable concepts without codable children: Secondary | ICD-10-CM

## 2014-11-20 MED ORDER — IBUPROFEN 600 MG PO TABS: 600.0000 mg | ORAL_TABLET | Freq: Four times a day (QID) | ORAL | Status: DC | PRN

## 2014-11-20 MED ORDER — OXYCODONE-ACETAMINOPHEN 5-325 MG PO TABS: 1.0000 | ORAL_TABLET | ORAL | Status: DC | PRN

## 2014-11-20 NOTE — Progress Notes (Signed)
Post Partum Day 1-2 Subjective: no complaints, up ad lib, voiding, tolerating PO and + flatus  Objective: Blood pressure 103/61, pulse 64, temperature 98.6 F (37 C), temperature source Oral, resp. rate 16, height 5\' 2"  (1.575 m), weight 82.101 kg (181 lb), last menstrual period 02/09/2014, SpO2 100 %, unknown if currently breastfeeding.  Physical Exam:  General: alert, cooperative, appears stated age and no distress Lochia: appropriate Uterine Fundus: firm Perineum : healing well DVT Evaluation: No evidence of DVT seen on physical exam. Negative Homan's sign. No cords or calf tenderness.   Recent Labs  11/18/14 1500 11/19/14 0645  HGB 9.9* 9.0*  HCT 30.6* 27.6*    Assessment/Plan: Discharge home, Circumcision in office and Contraception will discuss at post partum visit   LOS: 2 days   Aqua Denslow STACIA 11/20/2014, 10:30 AM

## 2014-11-20 NOTE — Discharge Summary (Signed)
Obstetric Discharge Summary Reason for Admission: induction of labor Prenatal Procedures: NST and ultrasound Intrapartum Procedures: spontaneous vaginal delivery Postpartum Procedures: none Complications-Operative and Postpartum: none HEMOGLOBIN  Date Value Ref Range Status  11/19/2014 9.0* 12.0 - 15.0 g/dL Final   HCT  Date Value Ref Range Status  11/19/2014 27.6* 36.0 - 46.0 % Final    Physical Exam:  General: alert, cooperative, appears stated age and no distress Lochia: appropriate Uterine Fundus: firm Perineum : healing well DVT Evaluation: No evidence of DVT seen on physical exam. Negative Homan's sign. No cords or calf tenderness.  Discharge Diagnoses: Term Pregnancy-delivered  Discharge Information: Date: 11/20/2014 Activity: pelvic rest Diet: routine Medications: PNV, Ibuprofen and Percocet Condition: stable Instructions: refer to practice specific booklet Discharge to: home   Newborn Data: Live born female  Birth Weight: 7 lb 2.3 oz (3240 g) APGAR: 8, 9  Home with mother.  Essie HartINN, Johnny Latu STACIA 11/20/2014, 10:33 AM

## 2014-11-20 NOTE — Lactation Note (Signed)
This note was copied from the chart of Boy Reylene Berk. Lactation Consultation Note: Mother paged for lactation assistance. When I arrived mother request assistance to latch infant. Mother has large flat inverted nipples. Assist mother with a hand pump to firm nipple. A #30 flange was used. Reviewed application of the nipple shield. A 24 mm nipple shield was placed on the Rt breast' Prefilled with formula. Infant sustained latch for 25 mins. Infant was given 10 ml of formula through the nipple shield with a curved tip syringe. Assist with latch  on the alternate breast. Mother's left nipple has a tiny crack and observed a some bleeding from the nipple. Infant latched on with wide gape. Assist father in giving formula through the nipple shield. Parents were given a written plan as well as supplemental guidelines. Mother was advised to offer breast using the nipple shield, supplement and then post pump for 15-20 mins. Mother to be sat up with a DEBP and advised to pump every 2-3 hours. Father of infant very attentive and willing to assist. Lots of teaching with parents. Advised to feed infant 8-12 times in 24 hours.   Patient Name: Boy Samuel JesterDasjie Sheeran EAVWU'JToday's Date: 11/20/2014 Reason for consult: Follow-up assessment   Maternal Data    Feeding Feeding Type: Breast Fed Length of feed: 10 min  LATCH Score/Interventions Latch: Grasps breast easily, tongue down, lips flanged, rhythmical sucking. Intervention(s): Teach feeding cues;Waking techniques Intervention(s): Adjust position;Assist with latch;Breast compression  Audible Swallowing: Spontaneous and intermittent Intervention(s): Hand expression Intervention(s): Alternate breast massage  Type of Nipple: Inverted Intervention(s): Hand pump;Double electric pump Intervention(s): Shells;Hand pump  Comfort (Breast/Nipple): Soft / non-tender     Hold (Positioning): Assistance needed to correctly position infant at breast and maintain  latch. Intervention(s): Support Pillows;Position options  LATCH Score: 7  Lactation Tools Discussed/Used Tools: Nipple Shields Nipple shield size: 24   Consult Status Consult Status: Follow-up Date: 11/20/14 Follow-up type: In-patient    Stevan BornKendrick, Halana Deisher [facility] 11/20/2014, 12:18 PM

## 2014-11-21 NOTE — Lactation Note (Signed)
This note was copied from the chart of Andrea Lynch. Lactation Consultation Note  Mom reports that Andrea Lynch is exclusively BF now with the nipple shield and that he is not interested in the supplement offered. Milk is blood tinged and parents are aware that it is ok to feed to the baby.  Mom and Dad deny any questions or concerns.   Plan for discharge is:  BF with cues Post-pump 6 times a day for 10 minutes Supplement as needed per volume guidelines using the Foley cup or curved tip syringe. Monitor output per [facility] of Baby and Me p..24 Outpatient appointment on Wednesday at 1pm Written Plan given to mom   Patient Name: Andrea Andrea Lynch ZOXWR'UToday's Date: 11/21/2014     Maternal Data    Feeding Feeding Type: Breast Fed Length of feed: 20 min  LATCH Score/Interventions Latch: Repeated attempts needed to sustain latch, nipple held in mouth throughout feeding, stimulation needed to elicit sucking reflex. Intervention(s): Skin to skin Intervention(s): Adjust position;Assist with latch  Audible Swallowing: None Intervention(s): Hand expression  Type of Nipple: Flat Intervention(s):  (shield)  Comfort (Breast/Nipple): Soft / non-tender     Hold (Positioning): No assistance needed to correctly position infant at breast.  LATCH Score: 6  Lactation Tools Discussed/Used     Consult Status      Andrea Lynch, Andrea Lynch 11/21/2014, 11:47 AM

## 2014-11-25 ENCOUNTER — Ambulatory Visit (HOSPITAL_COMMUNITY)
Admit: 2014-11-25 | Discharge: 2014-11-25 | Disposition: A | Discharge status: Home / Self Care | Payer: Medicaid Other | Attending: Obstetrics & Gynecology | Admitting: Obstetrics & Gynecology

## 2014-11-25 NOTE — Lactation Note (Signed)
Lactation Consult  Mother's reason for visit:  Make sure breastfeeding is okay Visit Type:  Feeding assessment Appointment Notes:  Nipple shield Consult:  Initial Lactation Consultant:  Huston FoleyMOULDEN, [place] Baldwin S  ________________________________________________________________________   Baby's Name: Wenda OverlandKaeden Garner Date of Birth: 11/19/2015 Pediatrician: Zenaida NieceAmos Gender: female Gestational Age: 3312w3d (At Birth) Birth Weight: 7 lb 2.3 oz (3240 g) Weight at Discharge: Weight: 6 lb 11.9 oz (3060 g)Date of Discharge: 11/21/2014 Filed Weights   11/19/14 0038 11/20/14 0019 11/21/14 0033  Weight: 7 lb 2.3 oz (3240 g) 6 lb 13.9 oz (3115 g) 6 lb 11.9 oz (3060 g)   [facility]: 7-4 on 11/23/14 Location:Pediatrician's office Weight today: 7-2.7     ________________________________________________________________________  Mother's Name: Andrea Lynch Type of delivery:  vaginal Breastfeeding Experience:  First baby    ________________________________________________________________________  Breastfeeding History (Post Discharge)  Frequency of breastfeeding:  2-3 times per day Duration of feeding:  30-60 minutes  Supplementation  Formula:  Volume 90-1920ml Frequency:  Every 3 hours       Brand: Similac  Breastmilk:  Volume 90-16020ml Frequency:  2-3 times per day   Method:  Bottle,   Pumping  Type of pump:  Manual Frequency:  2-3 times per day Volume:  60-16920ml  Infant Intake and Output Assessment  Voids:  4-5 in 24 hrs.  Stools:  1-2 in 24 hrs.  Color:  Green  ________________________________________________________________________     _______________________________________________________________________ Feeding Assessment/Evaluation  Mom and 6 day old infant here for follow up from hospital.  Baby had some difficulty latching in hospital and a nipple shield was started.  Mom states milk is in and baby is  latching well with nipple shield.  Mom chooses to also formula feed baby.  Discussed supply and demand and importance of good breast emptying every 3 hours.  Mom using a manual pump because [facility]  [facility].  Explained to mom [facility] and change to formula vouchers later if desired.  Baby just ate 90 mls of formula prior to visit and is sleeping.  He also just had circumcision done.  Mom states she doesn't need assist with latch and will call us if problems/concerns arise.

## 2015-04-08 ENCOUNTER — Inpatient Hospital Stay (HOSPITAL_COMMUNITY)
Admission: AD | Admit: 2015-04-08 | Discharge: 2015-04-08 | Disposition: A | Discharge status: Home / Self Care | Payer: Medicaid Other | Source: Ambulatory Visit | Attending: Obstetrics & Gynecology | Admitting: Obstetrics & Gynecology

## 2015-04-08 ENCOUNTER — Encounter (HOSPITAL_COMMUNITY): Payer: Self-pay | Admitting: *Deleted

## 2015-04-08 DIAGNOSIS — N760 Acute vaginitis: Secondary | ICD-10-CM | POA: Diagnosis not present

## 2015-04-08 DIAGNOSIS — N9489 Other specified conditions associated with female genital organs and menstrual cycle: Secondary | ICD-10-CM

## 2015-04-08 DIAGNOSIS — N899 Noninflammatory disorder of vagina, unspecified: Secondary | ICD-10-CM | POA: Diagnosis present

## 2015-04-08 DIAGNOSIS — B9689 Other specified bacterial agents as the cause of diseases classified elsewhere: Secondary | ICD-10-CM

## 2015-04-08 DIAGNOSIS — N898 Other specified noninflammatory disorders of vagina: Secondary | ICD-10-CM

## 2015-04-08 LAB — URINALYSIS, ROUTINE W REFLEX MICROSCOPIC
Bilirubin Urine: NEGATIVE
GLUCOSE, UA: NEGATIVE mg/dL
Hgb urine dipstick: NEGATIVE
KETONES UR: NEGATIVE mg/dL
Leukocytes, UA: NEGATIVE
Nitrite: NEGATIVE
PH: 6 (ref 5.0–8.0)
Protein, ur: NEGATIVE mg/dL
Specific Gravity, Urine: 1.025 (ref 1.005–1.030)
Urobilinogen, UA: 1 mg/dL (ref 0.0–1.0)

## 2015-04-08 LAB — WET PREP, GENITAL
CLUE CELLS WET PREP: NONE SEEN
Trich, Wet Prep: NONE SEEN
Yeast Wet Prep HPF POC: NONE SEEN

## 2015-04-08 LAB — POCT PREGNANCY, URINE: PREG TEST UR: NEGATIVE

## 2015-04-08 MED ORDER — METRONIDAZOLE 0.75 % VA GEL: 1.0000 | Freq: Every day | VAGINAL | Status: DC

## 2015-04-08 NOTE — MAU Provider Note (Signed)
History     CSN: [id number]  Arrival date and time: 04/08/15 1009   First Provider Initiated Contact with Patient 04/08/15 1243      Chief Complaint  Patient presents with  . vag odor    HPI   Ms. Andrea Lynch is a 19 y.o. female G1P1001 at Unknown presenting to MAU with concerns about a fishy vaginal odor. The last time she had this odor she had a tampon stuck in her vagina. She would like me to look inside to see if there is anything foreign in there.  She first noticed the odor 2-3 days ago. She has not tried anything over the counter.   She has an IUD; she had it placed 2-3 months ago.   OB History    Gravida Para Term Preterm AB TAB SAB Ectopic Multiple Living   1 1 1       0 1      Past Medical History  Diagnosis Date  . Medical history non-contributory     Past Surgical History  Procedure Laterality Date  . No past surgeries      Family History  Problem Relation Age of Onset  . Hypertension Mother   . Hypertension Father   . Allergies Sister     History  Substance Use Topics  . Smoking status: Never Smoker   . Smokeless tobacco: Never Used  . Alcohol Use: No    Allergies: No Known Allergies  No prescriptions prior to admission   Results for orders placed or performed during the hospital encounter of 04/08/15 (from the past 48 hour(s))  Urinalysis, Routine w reflex microscopic (not at [facility])     Status: Abnormal   Collection Time: 04/08/15 10:30 AM  Result Value Ref Range   Color, Urine YELLOW YELLOW   APPearance HAZY (A) CLEAR   Specific Gravity, Urine 1.025 1.005 - 1.030   pH 6.0 5.0 - 8.0   Glucose, UA NEGATIVE NEGATIVE mg/dL   Hgb urine dipstick NEGATIVE NEGATIVE   Bilirubin Urine NEGATIVE NEGATIVE   Ketones, ur NEGATIVE NEGATIVE mg/dL   Protein, ur NEGATIVE NEGATIVE mg/dL   Urobilinogen, UA 1.0 0.0 - 1.0 mg/dL   Nitrite NEGATIVE NEGATIVE   Leukocytes, UA NEGATIVE NEGATIVE    Comment: MICROSCOPIC NOT DONE ON URINES WITH NEGATIVE PROTEIN,  BLOOD, LEUKOCYTES, NITRITE, OR GLUCOSE <1000 mg/dL.  Pregnancy, urine POC     Status: None   Collection Time: 04/08/15 10:38 AM  Result Value Ref Range   Preg Test, Ur NEGATIVE NEGATIVE    Comment:        THE SENSITIVITY OF THIS METHODOLOGY IS >24 mIU/mL   Wet prep, genital     Status: Abnormal   Collection Time: 04/08/15 11:45 AM  Result Value Ref Range   Yeast Wet Prep HPF POC NONE SEEN NONE SEEN   Trich, Wet Prep NONE SEEN NONE SEEN   Clue Cells Wet Prep HPF POC NONE SEEN NONE SEEN   WBC, Wet Prep HPF POC FEW (A) NONE SEEN    Comment: MODERATE BACTERIA SEEN    Review of Systems  Constitutional: Negative for fever.  Gastrointestinal: Negative for abdominal pain.  Genitourinary: Negative for dysuria.       Denies abnormal discharge    Physical Exam   Blood pressure 120/62, pulse 65, temperature 98.3 F (36.8 C), temperature source Oral, resp. rate 18, height 5' 0.5" (1.537 m), weight 77.565 kg (171 lb), last menstrual period 03/01/2015, unknown if currently breastfeeding.  Physical Exam  Constitutional: She is oriented to person, place, and time. She appears well-developed and well-nourished. No distress.  HENT:  Head: Normocephalic.  Eyes: Pupils are equal, round, and reactive to light.  Neck: Neck supple.  Respiratory: Effort normal.  GI: Soft. She exhibits no distension. There is no tenderness. There is no rebound.  Genitourinary:  Speculum exam: Vagina - Small amount of creamy discharge, no odor. IUD strings visualized  Cervix - No contact bleeding Bimanual exam: Cervix closed, IUD strings palpated, no CMT Uterus non tender, normal size Adnexa non tender, no masses bilaterally GC/Chlam, wet prep done collect by RN without speculum  Chaperone present for exam.  Musculoskeletal: Normal range of motion.  Neurological: She is alert and oriented to person, place, and time.  Skin: Skin is warm. She is not diaphoretic.  Psychiatric: Her behavior is normal.    MAU  Course  Procedures  none  MDM   Assessment and Plan   A:  1. Vaginal odor   2. BV (bacterial vaginosis) : presumed    P:  Discharge home in stable condition RX: metrogel Return to MAU for emergencies only Condoms always    Duane Lope, NP 04/08/2015 12:47 PM

## 2015-04-08 NOTE — MAU Note (Signed)
Vaginal odor, started a couple days ago.  Denies d/c or bleeding. No pain

## 2015-04-09 LAB — GC/CHLAMYDIA PROBE AMP (~~LOC~~) NOT AT ARMC
Chlamydia: NEGATIVE
Neisseria Gonorrhea: NEGATIVE

## 2015-04-09 LAB — HIV ANTIBODY (ROUTINE TESTING W REFLEX): HIV Screen 4th Generation wRfx: NONREACTIVE

## 2015-08-15 ENCOUNTER — Encounter (HOSPITAL_COMMUNITY): Payer: Self-pay | Admitting: *Deleted

## 2015-08-15 ENCOUNTER — Emergency Department (HOSPITAL_COMMUNITY)
Admission: EM | Admit: 2015-08-15 | Discharge: 2015-08-15 | Disposition: A | Discharge status: Home / Self Care | Payer: Medicaid Other | Attending: Physician Assistant | Admitting: Physician Assistant

## 2015-08-15 DIAGNOSIS — Z792 Long term (current) use of antibiotics: Secondary | ICD-10-CM | POA: Insufficient documentation

## 2015-08-15 DIAGNOSIS — Z3202 Encounter for pregnancy test, result negative: Secondary | ICD-10-CM | POA: Insufficient documentation

## 2015-08-15 DIAGNOSIS — R300 Dysuria: Secondary | ICD-10-CM | POA: Diagnosis present

## 2015-08-15 DIAGNOSIS — N390 Urinary tract infection, site not specified: Secondary | ICD-10-CM | POA: Diagnosis not present

## 2015-08-15 LAB — URINALYSIS, ROUTINE W REFLEX MICROSCOPIC
Bilirubin Urine: NEGATIVE
GLUCOSE, UA: NEGATIVE mg/dL
Hgb urine dipstick: NEGATIVE
Ketones, ur: NEGATIVE mg/dL
Nitrite: POSITIVE — AB
PROTEIN: NEGATIVE mg/dL
SPECIFIC GRAVITY, URINE: 1.021 (ref 1.005–1.030)
pH: 6 (ref 5.0–8.0)

## 2015-08-15 LAB — WET PREP, GENITAL
CLUE CELLS WET PREP: NONE SEEN
Sperm: NONE SEEN
Trich, Wet Prep: NONE SEEN
Yeast Wet Prep HPF POC: NONE SEEN

## 2015-08-15 LAB — URINE MICROSCOPIC-ADD ON: RBC / HPF: NONE SEEN RBC/hpf (ref 0–5)

## 2015-08-15 LAB — CBC
HEMATOCRIT: 35.1 % — AB (ref 36.0–46.0)
HEMOGLOBIN: 11.1 g/dL — AB (ref 12.0–15.0)
MCH: 27.5 pg (ref 26.0–34.0)
MCHC: 31.6 g/dL (ref 30.0–36.0)
MCV: 86.9 fL (ref 78.0–100.0)
PLATELETS: 381 10*3/uL (ref 150–400)
RBC: 4.04 MIL/uL (ref 3.87–5.11)
RDW: 13.9 % (ref 11.5–15.5)
WBC: 8.4 10*3/uL (ref 4.0–10.5)

## 2015-08-15 LAB — POC URINE PREG, ED: PREG TEST UR: NEGATIVE

## 2015-08-15 MED ORDER — CEFTRIAXONE SODIUM 250 MG IJ SOLR
250.0000 mg | Freq: Once | INTRAMUSCULAR | Status: AC
  Administered 2015-08-15: 250 mg via INTRAMUSCULAR
  Filled 2015-08-15: qty 250

## 2015-08-15 MED ORDER — ONDANSETRON HCL 4 MG PO TABS
4.0000 mg | ORAL_TABLET | Freq: Once | ORAL | Status: AC
  Administered 2015-08-15: 4 mg via ORAL
  Filled 2015-08-15: qty 1

## 2015-08-15 MED ORDER — AZITHROMYCIN 250 MG PO TABS
1000.0000 mg | ORAL_TABLET | Freq: Once | ORAL | Status: AC
  Administered 2015-08-15: 1000 mg via ORAL
  Filled 2015-08-15: qty 4

## 2015-08-15 MED ORDER — CEPHALEXIN 500 MG PO CAPS: 500.0000 mg | ORAL_CAPSULE | Freq: Four times a day (QID) | ORAL | Status: DC

## 2015-08-15 NOTE — ED Notes (Signed)
Pt reports foul smelling urine x 2 days. Denies pain with urination. Denies vaginal discharge or itching.

## 2015-08-15 NOTE — ED Provider Notes (Signed)
CSN: [id number]     Arrival date & time 08/15/15  1240 History   First MD Initiated Contact with Patient 08/15/15 1326     Chief Complaint  Patient presents with  . Dysuria     (Consider location/radiation/quality/duration/timing/severity/associated sxs/prior Treatment) HPI   Patient is a 19 year old female presenting with foul-smelling urine. Patient reports to be sexually active. She states that she has no dysuria. She has no abdominal pain. She has no pain with sex. She does have white discharge. And she reports she has a foul-smelling both urine and vaginal discharge.  No fevers no nausea no vomiting no diarrhea.  Past Medical History  Diagnosis Date  . Medical history non-contributory    Past Surgical History  Procedure Laterality Date  . No past surgeries     Family History  Problem Relation Age of Onset  . Hypertension Mother   . Hypertension Father   . Allergies Sister    Social History  Substance Use Topics  . Smoking status: Never Smoker   . Smokeless tobacco: Never Used  . Alcohol Use: No   OB History    Gravida Para Term Preterm AB TAB SAB Ectopic Multiple Living   1 1 1       0 1     Review of Systems  Constitutional: Negative for activity change and fatigue.  Respiratory: Negative for cough and chest tightness.   Cardiovascular: Negative for chest pain.  Gastrointestinal: Negative for nausea, abdominal pain, diarrhea and abdominal distention.  Genitourinary: Positive for vaginal discharge. Negative for dysuria, hematuria, vaginal bleeding, difficulty urinating and genital sores.  Musculoskeletal: Negative for joint swelling.  Skin: Negative for rash.  Allergic/Immunologic: Negative for immunocompromised state.      Allergies  Review of patient's allergies indicates no known allergies.  Home Medications   Prior to Admission medications   Medication Sig Start Date End Date Taking? Authorizing Provider  cephALEXin (KEFLEX) 500 MG capsule Take 1  capsule (500 mg total) by mouth 4 (four) times daily. 08/15/15   Abrina Petz Lyn Basel Defalco, MD  metroNIDAZOLE (METROGEL VAGINAL) 0.75 % vaginal gel Place 1 Applicatorful vaginally at bedtime. Patient not taking: Reported on 08/15/2015 04/08/15   Duane LopeJennifer I Rasch, NP   BP 122/78 mmHg  Pulse 69  Temp(Src) 98.1 F (36.7 C) (Oral)  Resp 17  Ht 5\' 2"  (1.575 m)  Wt 172 lb 9 oz (78.274 kg)  BMI 31.55 kg/m2  SpO2 100%  LMP 07/16/2015 Physical Exam  Constitutional: She is oriented to person, place, and time. She appears well-developed and well-nourished.  HENT:  Head: Normocephalic and atraumatic.  Eyes: Conjunctivae are normal. Right eye exhibits no discharge.  Neck: Neck supple.  Cardiovascular: Normal rate, regular rhythm and normal heart sounds.   No murmur heard. Pulmonary/Chest: Effort normal and breath sounds normal. She has no wheezes. She has no rales.  Abdominal: Soft. She exhibits no distension. There is no tenderness.  Genitourinary:  Vaginal exam show white discharge, < 1 cm friable tissue and mirena in place. No CMT  Musculoskeletal: Normal range of motion. She exhibits no edema.  Neurological: She is oriented to person, place, and time. No cranial nerve deficit.  Skin: Skin is warm and dry. No rash noted. She is not diaphoretic.  Psychiatric: She has a normal mood and affect. Her behavior is normal.  Nursing note and vitals reviewed.   ED Course  Procedures (including critical care time) Labs Review Labs Reviewed  WET PREP, GENITAL - Abnormal; Notable for the following:  WBC, Wet Prep HPF POC MANY (*)    All other components within normal limits  CBC - Abnormal; Notable for the following:    Hemoglobin 11.1 (*)    HCT 35.1 (*)    All other components within normal limits  URINALYSIS, ROUTINE W REFLEX MICROSCOPIC (NOT AT [facility]) - Abnormal; Notable for the following:    APPearance CLOUDY (*)    Nitrite POSITIVE (*)    Leukocytes, UA SMALL (*)    All other components  within normal limits  URINE MICROSCOPIC-ADD ON - Abnormal; Notable for the following:    Squamous Epithelial / LPF 6-30 (*)    Bacteria, UA MANY (*)    All other components within normal limits  URINE CULTURE  POC URINE PREG, ED  GC/CHLAMYDIA PROBE AMP ([place]) NOT AT [facility]    Imaging Review No results found. I have personally reviewed and evaluated these images and lab results as part of my medical decision-making.   EKG Interpretation None      MDM   Final diagnoses:  UTI (lower urinary tract infection)   patient is a 19 year old sexually active female presenting with foul-smelling urine and vaginal discharge. Vag exam showed mild discharge, mild friable tissue around mirena and no CMT.  We will treat presumptively for chlamydia and gonorrhea given the discharge and friable tissue. We will get UA and U pregnancy. And follow-up wet mount  Wet mount, no additional clue cells. Treating UTI with keflex.   Irl Bodie Randall An, MD 08/15/15 [phone]

## 2015-08-15 NOTE — ED Notes (Signed)
Pt unable to give urine sample at triage.  

## 2015-08-16 LAB — GC/CHLAMYDIA PROBE AMP (~~LOC~~) NOT AT ARMC
Chlamydia: NEGATIVE
Neisseria Gonorrhea: NEGATIVE

## 2015-08-17 LAB — URINE CULTURE

## 2015-08-18 ENCOUNTER — Telehealth (HOSPITAL_BASED_OUTPATIENT_CLINIC_OR_DEPARTMENT_OTHER): Payer: Self-pay | Admitting: Emergency Medicine

## 2015-08-18 NOTE — Telephone Encounter (Signed)
Post ED Visit - Positive Culture Follow-up  Culture report reviewed by antimicrobial stewardship pharmacist:  []  Enzo BiNathan Batchelder, Pharm.D. []  Celedonio MiyamotoJeremy Frens, Pharm.D., BCPS [x]  Garvin FilaMike Maccia, Pharm.D. []  Georgina PillionElizabeth Martin, [address]Ph[address] BCPS []  Hayti HeightsMinh Pham, [address]Ph[address]D., BCPS, AAHIVP []  Estella HuskMichelle Cargle, Pharm.D., BCPS, AAHIVP []  Tennis Mustassie Stewart, Pharm.D. []  Sherle Poeob Vincent, [address]Ph[address]  Positive urine  Culture E. coli Treated with cephalexin, organism sensitive to the same and no further patient follow-up is required at this time.  Berle MullMiller, Muscab Brenneman 08/18/2015, 9:25 AM

## 2015-12-29 ENCOUNTER — Other Ambulatory Visit: Payer: Self-pay | Admitting: Obstetrics

## 2015-12-31 IMAGING — US US OB NUCHAL TRANSLUCENCY 1ST GEST
1 series · 13 of 26 positions shown · non-contrast
Comparison: none

[Series 1: us ob nuchal translucency 1st gest · 0.19mm/px · 13 of 26 slices shown]
[im 2/26]
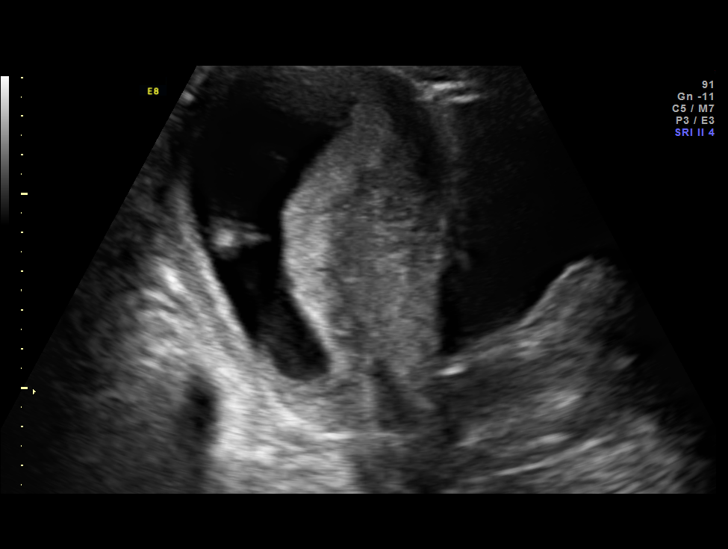
[im 4/26]
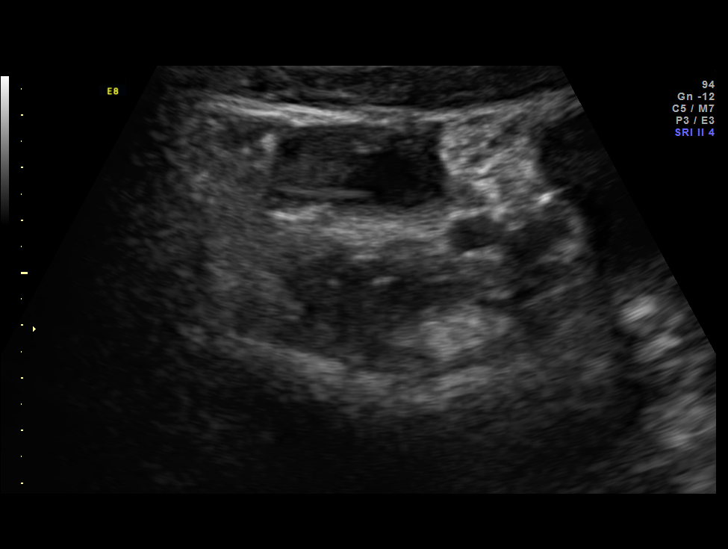
[im 6/26]
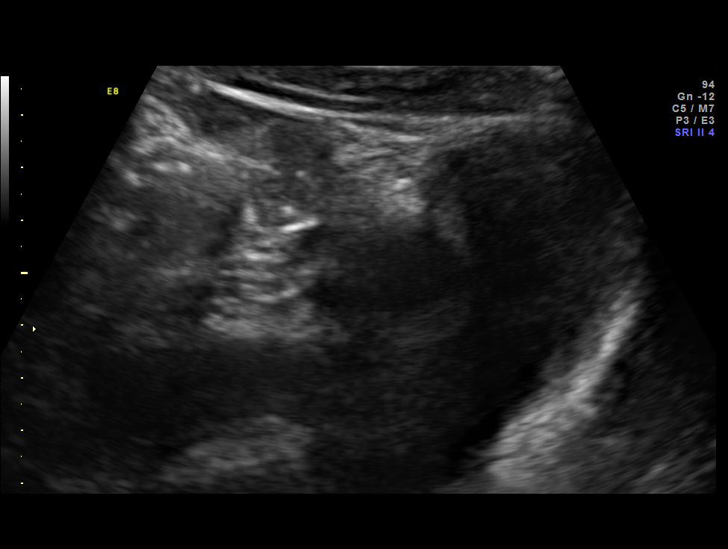
[im 8/26]
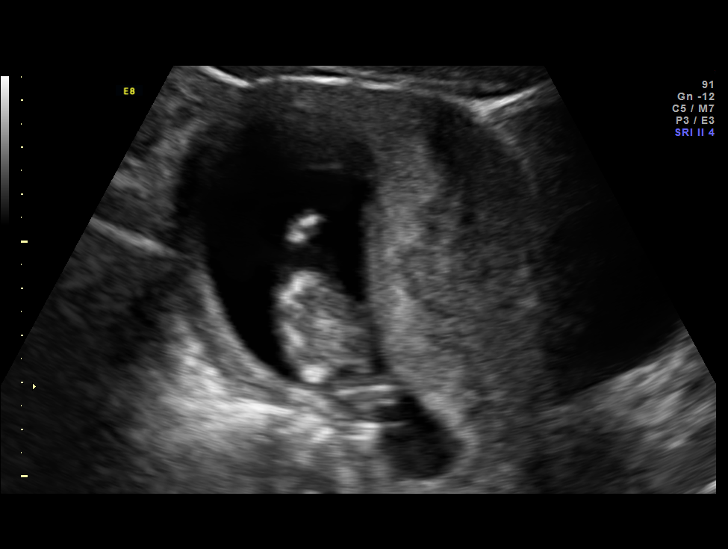
[im 10/26]
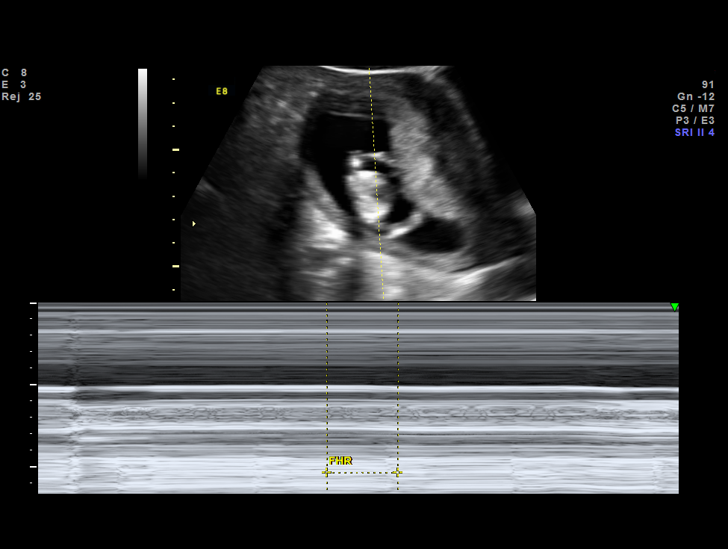
[im 12/26]
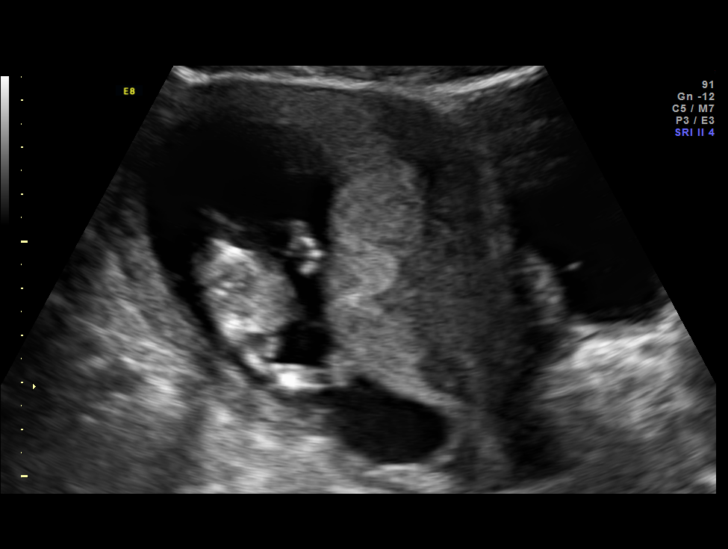
[im 14/26]
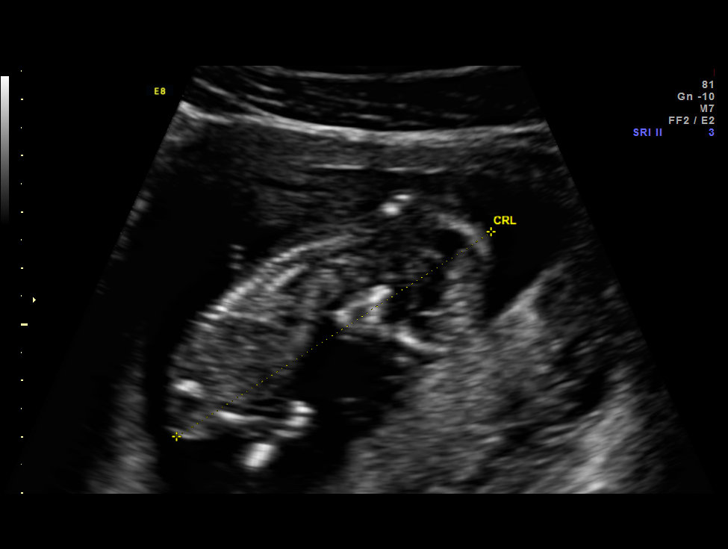
[im 16/26]
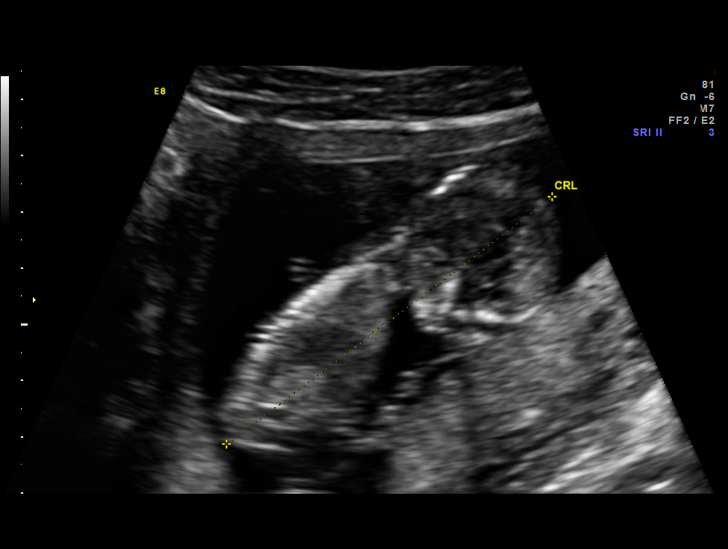
[im 18/26]
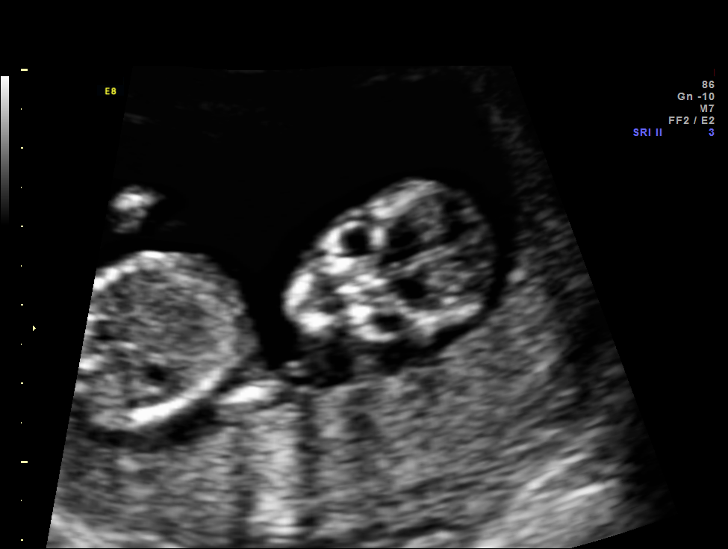
[im 20/26]
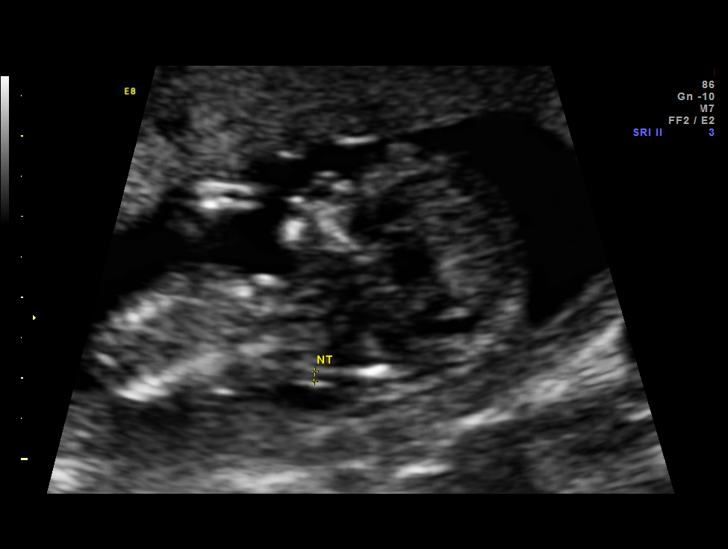
[im 22/26]
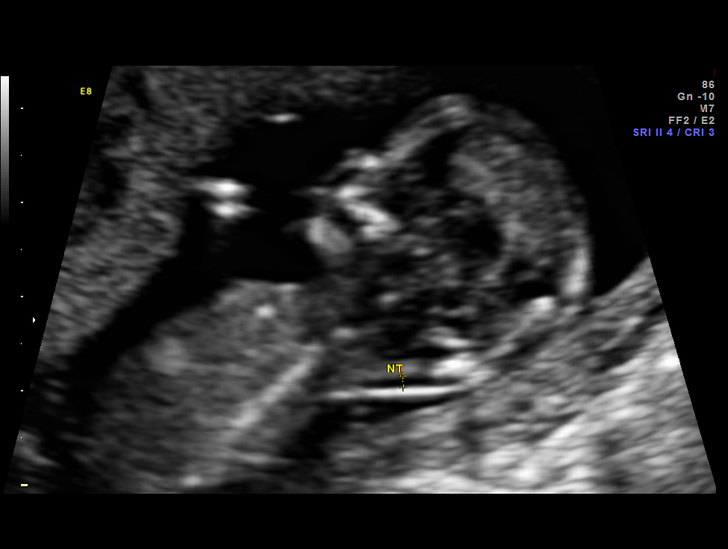
[im 24/26]
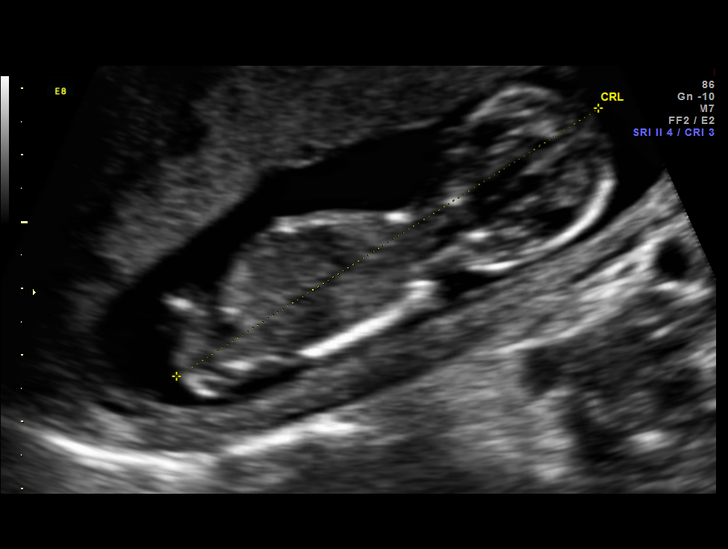
[im 26/26]
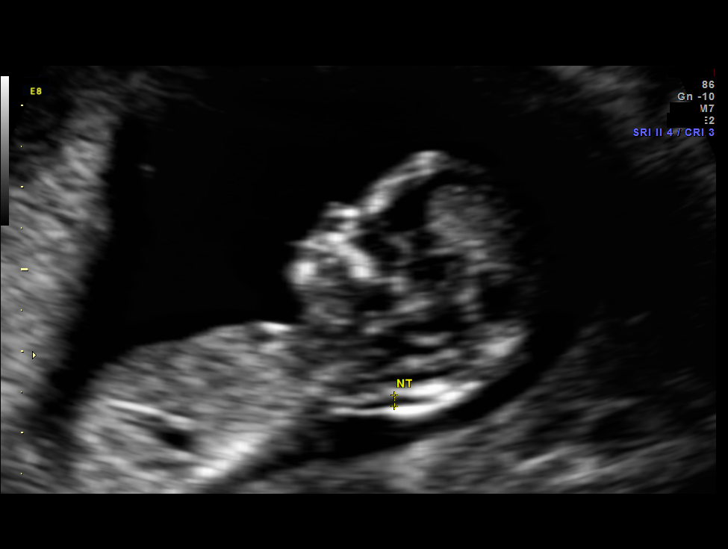

[13 of 26 positions shown; findings below may reference images not displayed]

OBSTETRICS REPORT
                      (Signed Final 05/19/2014 [DATE])

Service(s) Provided

 US FETAL NUCHAL TRANSLUCENCY                          76813.0
 MEASUREMENT
Indications

 First trimester aneuploidy screen (NT)
Fetal Evaluation

 Num Of Fetuses:    1
 Fetal Heart Rate:  141                          bpm
 Cardiac Activity:  Observed
 Presentation:      Variable
 Placenta:          Anterior

 Amniotic Fluid
 AFI FV:      Subjectively within normal limits
Gestational Age

 LMP:           14w 1d        Date:  02/09/14                 EDD:   11/16/14
 Best:          14w 1d     Det. By:  LMP  (02/09/14)          EDD:   11/16/14
1st Trimester Genetic Sonogram Screening

 CRL:            74.9  mm    G. Age:   13w 2d                 EDD:   11/22/14
 Nuc Trans:       1.4  mm

 Nasal Bone:                 Present
Cervix Uterus Adnexa

 Cervix:       Normal appearance by transabdominal scan. Appears
               closed, without funnelling.

 Left Ovary:    Not visualized. No adnexal mass visualized.
 Right Ovary:   Within normal limits.
Impression

 SIUP at 84w8d
 NT = 1.4mm
 Nasal bone is present
 Fetal morphology is gestational age appropriate
Recommendations

 Fetal survey in 4-5 weeks
 Formal risk assessment will be made available by the lab.

 questions or concerns.

## 2016-02-23 ENCOUNTER — Encounter (HOSPITAL_COMMUNITY): Payer: Self-pay | Admitting: Emergency Medicine

## 2016-02-23 ENCOUNTER — Ambulatory Visit (HOSPITAL_COMMUNITY)
Admission: EM | Admit: 2016-02-23 | Discharge: 2016-02-23 | Disposition: A | Discharge status: Home / Self Care | Payer: Medicaid Other | Attending: Emergency Medicine | Admitting: Emergency Medicine

## 2016-02-23 DIAGNOSIS — R51 Headache: Secondary | ICD-10-CM | POA: Diagnosis present

## 2016-02-23 DIAGNOSIS — J029 Acute pharyngitis, unspecified: Secondary | ICD-10-CM

## 2016-02-23 LAB — POCT RAPID STREP A: Streptococcus, Group A Screen (Direct): NEGATIVE

## 2016-02-23 MED ORDER — LORATADINE 10 MG PO TABS: 10.0000 mg | ORAL_TABLET | Freq: Every day | ORAL | Status: DC | PRN

## 2016-02-23 NOTE — ED Notes (Signed)
[facility] a [facility] and sore throat x 2 weeks. The patient stated that she has tried OTC meds with minimal results.

## 2016-02-23 NOTE — Discharge Instructions (Signed)

## 2016-02-23 NOTE — ED Provider Notes (Signed)
CSN: [id number]     Arrival date & time 02/23/16  1326 History   First MD Initiated Contact with Patient 02/23/16 1441     Chief Complaint  Patient presents with  . Sore Throat  . Headache   (Consider location/radiation/quality/duration/timing/severity/associated sxs/prior Treatment) HPI Comments: Patient complains of headache and sore throat during the day for the past 2 weeks. Started with cough which has resolved. Has taken Ibuprofen with some relief for headache but minimal relief for throat pain. No other family members ill.   Patient is a 20 y.o. female presenting with pharyngitis and headaches. The history is provided by the patient.  Sore Throat This is a new problem. The current episode started more than 1 week ago. The problem occurs daily. The problem has not changed since onset.Associated symptoms include headaches. The symptoms are aggravated by swallowing. Nothing relieves the symptoms. Treatments tried: Ibuprofen helps headache but not sore throat.  Headache Associated symptoms: cough (Started with cough which has resolved), drainage, fatigue, sinus pressure and sore throat   Associated symptoms: no fever     Past Medical History  Diagnosis Date  . Medical history non-contributory    Past Surgical History  Procedure Laterality Date  . No past surgeries     Family History  Problem Relation Age of Onset  . Hypertension Mother   . Hypertension Father   . Allergies Sister    Social History  Substance Use Topics  . Smoking status: Never Smoker   . Smokeless tobacco: Never Used  . Alcohol Use: No   OB History    Gravida Para Term Preterm AB TAB SAB Ectopic Multiple Living   0 1     Review of Systems  Constitutional: Positive for fatigue. Negative for fever.  HENT: Positive for postnasal drip, sinus pressure and sore throat.   Respiratory: Positive for cough (Started with cough which has resolved).   Skin: Negative.   Neurological: Positive for  headaches.    Allergies  Review of patient's allergies indicates no known allergies.  Home Medications   Prior to Admission medications   Medication Sig Start Date End Date Taking? Authorizing Provider  loratadine (CLARITIN) 10 MG tablet Take 1 tablet (10 mg total) by mouth daily as needed for allergies. 02/23/16   Sudie Grumbling, NP   Meds Ordered and Administered this Visit  Medications - No data to display  BP 110/67 mmHg  Pulse 64  Temp(Src) 98.9 F (37.2 C) (Oral)  SpO2 100%  LMP 02/21/2016 (Exact Date) No data found.   Physical Exam  Constitutional: She is oriented to person, place, and time. She appears well-developed and well-nourished.  HENT:  Head: Normocephalic and atraumatic.  Right Ear: Hearing, tympanic membrane, external ear and ear canal normal.  Left Ear: Hearing, tympanic membrane, external ear and ear canal normal.  Nose: Nose normal. Right sinus exhibits no maxillary sinus tenderness and no frontal sinus tenderness. Left sinus exhibits no maxillary sinus tenderness and no frontal sinus tenderness.  Mouth/Throat: Uvula is midline and mucous membranes are normal. Posterior oropharyngeal erythema present. No oropharyngeal exudate.  Neck: Normal range of motion. Neck supple.  Cardiovascular: Normal rate, regular rhythm and normal heart sounds.   Pulmonary/Chest: Effort normal and breath sounds normal. No respiratory distress. She has no wheezes.  Lymphadenopathy:    She has no cervical adenopathy.  Neurological: She is alert and oriented to person, place, and time.  Skin: Skin is warm and  dry.    ED Course  Procedures (including critical care time)  Labs Review Labs Reviewed  POCT RAPID STREP A    Imaging Review No results found.   Visual Acuity Review  Right Eye Distance:   Left Eye Distance:   Bilateral Distance:    Right Eye Near:   Left Eye Near:    Bilateral Near:         MDM   1. Acute pharyngitis, unspecified pharyngitis type     Rapid strep test was negative. Discussed with patient that sore throat may be viral or allergic in nature. Recommend Claritin as directed and may continue Ibuprofen as needed for headache and pain. May use OTC Chloraseptic as needed. Recommend follow-up in 3 to 4 days if no improvement in symptoms.      Sudie GrumblingAnn Berry Leighton Brickley, NP 02/23/16 1531  Sudie GrumblingAnn Berry Henriette Hesser, NP 02/23/16 [phone]

## 2016-02-26 LAB — CULTURE, GROUP A STREP (THRC)

## 2018-10-29 ENCOUNTER — Encounter (HOSPITAL_COMMUNITY): Payer: Self-pay

## 2018-10-29 ENCOUNTER — Ambulatory Visit (HOSPITAL_COMMUNITY)
Admission: EM | Admit: 2018-10-29 | Discharge: 2018-10-29 | Disposition: A | Discharge status: Home / Self Care | Payer: Self-pay | Attending: Family Medicine | Admitting: Family Medicine

## 2018-10-29 DIAGNOSIS — L03012 Cellulitis of left finger: Secondary | ICD-10-CM

## 2018-10-29 MED ORDER — AMOXICILLIN-POT CLAVULANATE 875-125 MG PO TABS: 1.0000 | ORAL_TABLET | Freq: Two times a day (BID) | ORAL | 0 refills | Status: DC

## 2018-10-29 MED ORDER — MUPIROCIN 2 % EX OINT: 1.0000 "application " | TOPICAL_OINTMENT | Freq: Two times a day (BID) | CUTANEOUS | 0 refills | Status: DC

## 2018-10-29 MED ORDER — FLUCONAZOLE 150 MG PO TABS: 150.0000 mg | ORAL_TABLET | Freq: Every day | ORAL | 0 refills | Status: DC

## 2018-10-29 NOTE — ED Provider Notes (Signed)
[facility]    CSN: [id number] Arrival date & time: 10/29/18  1217     History   Chief Complaint Chief Complaint  Patient presents with  . Finger Infection    HPI Andrea Lynch is a 23 y.o. female.   23 year old female comes in for few day history of left index finger swelling and discoloration after removing a hangnail.  States she bit off a hangnail.  Has noticed swelling with pain since then.  Denies spreading erythema, warmth.  Denies fever, chills, night sweats.  Able to move fingers without difficulty.  Denies numbness, tingling.  Has not tried anything for the symptoms.     Past Medical History:  Diagnosis Date  . Medical history non-contributory     Patient Active Problem List   Diagnosis Date Noted  . Status post normal vaginal delivery 11/20/2014  . Non-reactive NST (non-stress test) 11/18/2014    Past Surgical History:  Procedure Laterality Date  . NO PAST SURGERIES      OB History    Gravida  1   Para  1   Term  1   Preterm      AB      Living  1     SAB      TAB      Ectopic      Multiple  0   Live Births  1            Home Medications    Prior to Admission medications   Medication Sig Start Date End Date Taking? Authorizing Provider  amoxicillin-clavulanate (AUGMENTIN) 875-125 MG tablet Take 1 tablet by mouth every 12 (twelve) hours. 10/29/18   Cathie Hoops, Shawneen Deetz V, PA-C  fluconazole (DIFLUCAN) 150 MG tablet Take 1 tablet (150 mg total) by mouth daily. Take second dose 72 hours later if symptoms still persists. 10/29/18   Cathie Hoops, Rylon Poitra V, PA-C  loratadine (CLARITIN) 10 MG tablet Take 1 tablet (10 mg total) by mouth daily as needed for allergies. 02/23/16   Sudie Grumbling, NP  mupirocin ointment (BACTROBAN) 2 % Apply 1 application topically 2 (two) times daily. 10/29/18   Belinda Fisher, PA-C    Family History Family History  Problem Relation Age of Onset  . Hypertension Mother   . Hypertension Father   . Allergies Sister      Social History Social History   Tobacco Use  . Smoking status: Never Smoker  . Smokeless tobacco: Never Used  Substance Use Topics  . Alcohol use: No  . Drug use: No     Allergies   Patient has no known allergies.   Review of Systems Review of Systems  Reason unable to perform ROS: See HPI as above.     Physical Exam Triage Vital Signs ED Triage Vitals  Enc Vitals Group     BP 10/29/18 1338 118/62     Pulse Rate 10/29/18 1338 63     Resp 10/29/18 1338 20     Temp 10/29/18 1338 98.8 F (37.1 C)     Temp Source 10/29/18 1338 Oral     SpO2 10/29/18 1338 100 %     Weight --      Height --      Head Circumference --      Peak Flow --      Pain Score 10/29/18 1339 8     Pain Loc --      Pain Edu? --      Excl. in [place]? --  No data found.  Updated Vital Signs BP 118/62 (BP Location: Right Arm)   Pulse 63   Temp 98.8 F (37.1 C) (Oral)   Resp 20   LMP 10/22/2018   SpO2 100%   Physical Exam Constitutional:      General: She is not in acute distress.    Appearance: She is well-developed. She is not diaphoretic.  HENT:     Head: Normocephalic and atraumatic.  Eyes:     Conjunctiva/sclera: Conjunctivae normal.     Pupils: Pupils are equal, round, and reactive to light.  Musculoskeletal:     Comments: Paronychia of the left index finger.  Surrounding cellulitis.  No felon on exam.  Full range of motion.  Cap refill less than 2 seconds.  Skin:    General: Skin is warm and dry.  Neurological:     Mental Status: She is alert and oriented to person, place, and time.      UC Treatments / Results  Labs (all labs ordered are listed, but only abnormal results are displayed) Labs Reviewed - No data to display  EKG None  Radiology No results found.  Procedures Incision and Drainage Date/Time: 10/29/2018 2:53 PM Performed by: Belinda FisherYu, Renly Roots V, PA-C Authorized by: Eustace MooreNelson, Yvonne Sue, MD   Consent:    Consent obtained:  Verbal   Consent given by:   Patient   Risks discussed:  Bleeding, incomplete drainage, pain, infection and damage to other organs   Alternatives discussed:  Alternative treatment Location:    Type:  Abscess   Size:  0.5cm x 0.2cm   Location:  Upper extremity   Upper extremity location:  Finger   Finger location:  L index finger Pre-procedure details:    Skin preparation:  Chloraprep Anesthesia (see MAR for exact dosages):    Anesthesia method:  None Procedure type:    Complexity:  Simple Procedure details:    Needle aspiration: no     Incision types:  Stab incision   Incision depth:  Dermal   Scalpel blade:  11   Wound management:  Irrigated with saline   Drainage:  Purulent   Drainage amount:  Scant   Wound treatment:  Wound left open   Packing materials:  None Post-procedure details:    Patient tolerance of procedure:  Tolerated well, no immediate complications   (including critical care time)  Medications Ordered in UC Medications - No data to display  Initial Impression / Assessment and Plan / UC Course  I have reviewed the triage vital signs and the nursing notes.  Pertinent labs & imaging results that were available during my care of the patient were reviewed by me and considered in my medical decision making (see chart for details).    Patient tolerated procedure well. Start augmentin for surrounding cellulitis. Wound care instructions given. Return precautions given. Patient expresses understanding and agrees to plan.   Final Clinical Impressions(s) / UC Diagnoses   Final diagnoses:  Paronychia of finger, left    ED Prescriptions    Medication Sig Dispense Auth. Provider   amoxicillin-clavulanate (AUGMENTIN) 875-125 MG tablet Take 1 tablet by mouth every 12 (twelve) hours. 14 tablet Kimbrely Buckel V, PA-C   fluconazole (DIFLUCAN) 150 MG tablet Take 1 tablet (150 mg total) by mouth daily. Take second dose 72 hours later if symptoms still persists. 2 tablet Cory Rama V, PA-C   mupirocin ointment  (BACTROBAN) 2 % Apply 1 application topically 2 (two) times daily. 22 g Belinda FisherYu, Elain Wixon V, PA-C  Belinda Fisher, PA-C 10/29/18 1455

## 2018-10-29 NOTE — Discharge Instructions (Addendum)
Start Augmentin as directed. Warm compress to the area. You can remove current dressing in 24 hours. Keep wound clean and dry. You can clean gently with soap and water. Do not soak area in water. You can dress with bactroban ointment. If experiencing worsening symptoms, pain and swelling of the finger pad, go to the emergency department for further evaluation needed.

## 2018-10-29 NOTE — ED Triage Notes (Signed)
Pt presents with an infection in left index finger from a hangnail from a few days ago.

## 2018-12-14 ENCOUNTER — Encounter (HOSPITAL_COMMUNITY): Payer: Self-pay | Admitting: Family Medicine

## 2018-12-14 ENCOUNTER — Ambulatory Visit (HOSPITAL_COMMUNITY)
Admission: EM | Admit: 2018-12-14 | Discharge: 2018-12-14 | Disposition: A | Discharge status: Home / Self Care | Payer: 59 | Attending: Family Medicine | Admitting: Family Medicine

## 2018-12-14 ENCOUNTER — Other Ambulatory Visit: Payer: Self-pay

## 2018-12-14 DIAGNOSIS — N390 Urinary tract infection, site not specified: Secondary | ICD-10-CM | POA: Diagnosis not present

## 2018-12-14 DIAGNOSIS — Z3202 Encounter for pregnancy test, result negative: Secondary | ICD-10-CM | POA: Diagnosis not present

## 2018-12-14 LAB — POCT URINALYSIS DIP (DEVICE)
Bilirubin Urine: NEGATIVE
Glucose, UA: NEGATIVE mg/dL
Hgb urine dipstick: NEGATIVE
Ketones, ur: NEGATIVE mg/dL
Nitrite: POSITIVE — AB
Protein, ur: NEGATIVE mg/dL
Specific Gravity, Urine: 1.025 (ref 1.005–1.030)
Urobilinogen, UA: 0.2 mg/dL (ref 0.0–1.0)
pH: 6 (ref 5.0–8.0)

## 2018-12-14 LAB — POCT PREGNANCY, URINE: Preg Test, Ur: NEGATIVE

## 2018-12-14 MED ORDER — CEPHALEXIN 500 MG PO CAPS: 500.0000 mg | ORAL_CAPSULE | Freq: Three times a day (TID) | ORAL | 0 refills | Status: DC

## 2018-12-14 NOTE — ED Triage Notes (Addendum)
Pt reports burning with urination and odor x3 days along with some suprapubic aching.

## 2018-12-14 NOTE — ED Provider Notes (Signed)
[facility]    CSN: [id number] Arrival date & time: 12/14/18  1518     History   Chief Complaint Chief Complaint  Patient presents with  . Dysuria    HPI Andrea Lynch is a 23 y.o. female.   23 yo established female patient presents to [facility] with symptoms of urinary tract infection.   Last documented UTI:  08/15/2015 -E.Coli     Past Medical History:  Diagnosis Date  . Medical history non-contributory     Patient Active Problem List   Diagnosis Date Noted  . Status post normal vaginal delivery 11/20/2014  . Non-reactive NST (non-stress test) 11/18/2014    Past Surgical History:  Procedure Laterality Date  . NO PAST SURGERIES      OB History    Gravida  1   Para  1   Term  1   Preterm      AB      Living  1     SAB      TAB      Ectopic      Multiple  0   Live Births  1            Home Medications    Prior to Admission medications   Medication Sig Start Date End Date Taking? Authorizing Provider  cephALEXin (KEFLEX) 500 MG capsule Take 1 capsule (500 mg total) by mouth 3 (three) times daily. 12/14/18   Elvina Sidle, MD  loratadine (CLARITIN) 10 MG tablet Take 1 tablet (10 mg total) by mouth daily as needed for allergies. 02/23/16   Sudie Grumbling, NP    Family History Family History  Problem Relation Age of Onset  . Hypertension Mother   . Hypertension Father   . Allergies Sister     Social History Social History   Tobacco Use  . Smoking status: Never Smoker  . Smokeless tobacco: Never Used  Substance Use Topics  . Alcohol use: No  . Drug use: No     Allergies   Patient has no known allergies.   Review of Systems Review of Systems  Genitourinary: Positive for dyspareunia, dysuria and frequency.     Physical Exam Triage Vital Signs ED Triage Vitals  Enc Vitals Group     BP      Pulse      Resp      Temp      Temp src      SpO2      Weight      Height      Head Circumference      Peak  Flow      Pain Score      Pain Loc      Pain Edu?      Excl. in GC?    No data found.  Updated Vital Signs BP 109/74 (BP Location: Right Arm)   Pulse 77   Temp 98.9 F (37.2 C) (Oral)   Resp 12   LMP 11/13/2018 (Approximate)   SpO2 100%    Physical Exam Vitals signs and nursing note reviewed.  Constitutional:      Appearance: Normal appearance. She is obese.  HENT:     Head: Normocephalic.     Nose: Nose normal.     Mouth/Throat:     Mouth: Mucous membranes are moist.  Eyes:     Conjunctiva/sclera: Conjunctivae normal.  Neck:     Musculoskeletal: Normal range of motion and neck supple.  Cardiovascular:  Rate and Rhythm: Normal rate and regular rhythm.     Heart sounds: Normal heart sounds.  Pulmonary:     Effort: Pulmonary effort is normal.     Breath sounds: Normal breath sounds.  Musculoskeletal: Normal range of motion.  Skin:    General: Skin is warm and dry.  Neurological:     General: No focal deficit present.     Mental Status: She is alert.  Psychiatric:        Mood and Affect: Mood normal.      UC Treatments / Results  Labs (all labs ordered are listed, but only abnormal results are displayed) Labs Reviewed  POCT URINALYSIS DIP (DEVICE) - Abnormal; Notable for the following components:      Result Value   Nitrite POSITIVE (*)    Leukocytes,Ua SMALL (*)    All other components within normal limits  URINE CULTURE  POCT PREGNANCY, URINE    EKG None  Radiology No results found.  Procedures Procedures (including critical care time)  Medications Ordered in UC Medications - No data to display  Initial Impression / Assessment and Plan / UC Course  I have reviewed the triage vital signs and the nursing notes.  Pertinent labs & imaging results that were available during my care of the patient were reviewed by me and considered in my medical decision making (see chart for details).    Final Clinical Impressions(s) / UC Diagnoses    Final diagnoses:  Lower urinary tract infectious disease   Discharge Instructions   None    ED Prescriptions    Medication Sig Dispense Auth. Provider   cephALEXin (KEFLEX) 500 MG capsule Take 1 capsule (500 mg total) by mouth 3 (three) times daily. 20 capsule Elvina Sidle, MD     Controlled Substance Prescriptions [place] Controlled Substance Registry consulted? Not Applicable   Elvina Sidle, MD 12/14/18 1547

## 2018-12-16 ENCOUNTER — Telehealth (HOSPITAL_COMMUNITY): Payer: Self-pay | Admitting: Emergency Medicine

## 2018-12-16 LAB — URINE CULTURE: Culture: >=100000 — AB

## 2018-12-16 NOTE — Telephone Encounter (Signed)
Urine culture was positive for e coli and was given keflex at urgent care visit. Pt contacted and made aware, educated on completing antibiotic and to follow up if symptoms are persistent. Verbalized understanding.    

## 2019-08-10 ENCOUNTER — Ambulatory Visit (HOSPITAL_COMMUNITY)
Admission: EM | Admit: 2019-08-10 | Discharge: 2019-08-10 | Disposition: A | Discharge status: Home / Self Care | Payer: Medicaid Other | Attending: Family Medicine | Admitting: Family Medicine

## 2019-08-10 ENCOUNTER — Other Ambulatory Visit: Payer: Self-pay

## 2019-08-10 ENCOUNTER — Encounter (HOSPITAL_COMMUNITY): Payer: Self-pay

## 2019-08-10 DIAGNOSIS — R110 Nausea: Secondary | ICD-10-CM

## 2019-08-10 DIAGNOSIS — R1030 Lower abdominal pain, unspecified: Secondary | ICD-10-CM | POA: Diagnosis not present

## 2019-08-10 DIAGNOSIS — Z3401 Encounter for supervision of normal first pregnancy, first trimester: Secondary | ICD-10-CM

## 2019-08-10 DIAGNOSIS — Z3201 Encounter for pregnancy test, result positive: Secondary | ICD-10-CM | POA: Diagnosis not present

## 2019-08-10 LAB — POCT URINALYSIS DIP (DEVICE)
Bilirubin Urine: NEGATIVE
Glucose, UA: NEGATIVE mg/dL
Hgb urine dipstick: NEGATIVE
Ketones, ur: NEGATIVE mg/dL
Leukocytes,Ua: NEGATIVE
Nitrite: NEGATIVE
Protein, ur: NEGATIVE mg/dL
Specific Gravity, Urine: 1.02 (ref 1.005–1.030)
Urobilinogen, UA: 1 mg/dL (ref 0.0–1.0)
pH: 7 (ref 5.0–8.0)

## 2019-08-10 LAB — POCT PREGNANCY, URINE: Preg Test, Ur: POSITIVE — AB

## 2019-08-10 LAB — POC URINE PREG, ED: Preg Test, Ur: POSITIVE — AB

## 2019-08-10 MED ORDER — PRENATAL 19 29-1 MG PO CHEW: 1.0000 | CHEWABLE_TABLET | Freq: Every morning | ORAL | 3 refills | Status: DC

## 2019-08-10 MED ORDER — ONDANSETRON 8 MG PO TBDP: 8.0000 mg | ORAL_TABLET | Freq: Three times a day (TID) | ORAL | 0 refills | Status: DC | PRN

## 2019-08-10 NOTE — ED Triage Notes (Signed)
Pt presents to UC w/ c/o abd cramping x1 week. Pt states her menstrual is 9 days late.

## 2019-08-10 NOTE — Discharge Instructions (Signed)
Positive pregancy test  Start prenatal vitamins and make appointment with your obstetrician.

## 2019-08-10 NOTE — ED Provider Notes (Signed)
[facility]    CSN: [id number] Arrival date & time: 08/10/19  1613      History   Chief Complaint Chief Complaint  Patient presents with  . lower abd cramping    HPI Andrea Lynch is a 23 y.o. female.   This 23 year old woman who presents with lower abdominal cramps.  She is 9 days late for her period.  Patient is G1P1  She is not contracepting.  Patient feels pregnant with tender breasts and mild nausea with no vomiting.     Past Medical History:  Diagnosis Date  . Medical history non-contributory     Patient Active Problem List   Diagnosis Date Noted  . Status post normal vaginal delivery 11/20/2014  . Non-reactive NST (non-stress test) 11/18/2014    Past Surgical History:  Procedure Laterality Date  . NO PAST SURGERIES      OB History    Gravida  1   Para  1   Term  1   Preterm      AB      Living  1     SAB      TAB      Ectopic      Multiple  0   Live Births  1            Home Medications    Prior to Admission medications   Medication Sig Start Date End Date Taking? Authorizing Provider  loratadine (CLARITIN) 10 MG tablet Take 1 tablet (10 mg total) by mouth daily as needed for allergies. 02/23/16   Katy Apo, NP  ondansetron (ZOFRAN-ODT) 8 MG disintegrating tablet Take 1 tablet (8 mg total) by mouth every 8 (eight) hours as needed for nausea. 08/10/19   Robyn Haber, MD  Prenatal Vit-Fe Fumarate-FA (PRENATAL 19) 29-1 MG CHEW Chew 1 tablet by mouth every morning. 08/10/19   Robyn Haber, MD    Family History Family History  Problem Relation Age of Onset  . Hypertension Mother   . Hypertension Father   . Allergies Sister     Social History Social History   Tobacco Use  . Smoking status: Never Smoker  . Smokeless tobacco: Never Used  Substance Use Topics  . Alcohol use: No  . Drug use: No     Allergies   Patient has no known allergies.   Review of Systems Review of Systems    Physical Exam Triage Vital Signs ED Triage Vitals  Enc Vitals Group     BP 08/10/19 1641 137/79     Pulse Rate 08/10/19 1641 70     Resp 08/10/19 1641 16     Temp 08/10/19 1641 99.7 F (37.6 C)     Temp Source 08/10/19 1641 Oral     SpO2 08/10/19 1641 97 %     Weight --      Height --      Head Circumference --      Peak Flow --      Pain Score 08/10/19 1644 7     Pain Loc --      Pain Edu? --      Excl. in [place]? --    No data found.  Updated Vital Signs BP 137/79 (BP Location: Left Arm)   Pulse 70   Temp 99.7 F (37.6 C) (Oral)   Resp 16   LMP 07/01/2019   SpO2 97%    Physical Exam   UC Treatments / Results  Labs (all labs ordered are  listed, but only abnormal results are displayed) Labs Reviewed  POCT PREGNANCY, URINE - Abnormal; Notable for the following components:      Result Value   Preg Test, Ur POSITIVE (*)    All other components within normal limits  POC URINE PREG, ED - Abnormal; Notable for the following components:   Preg Test, Ur POSITIVE (*)    All other components within normal limits  POCT URINALYSIS DIP (DEVICE)    EKG   Radiology No results found.  Procedures Procedures (including critical care time)  Medications Ordered in UC Medications - No data to display  Initial Impression / Assessment and Plan / UC Course  I have reviewed the triage vital signs and the nursing notes.  Pertinent labs & imaging results that were available during my care of the patient were reviewed by me and considered in my medical decision making (see chart for details).    Final Clinical Impressions(s) / UC Diagnoses   Final diagnoses:  Encounter for supervision of normal first pregnancy in first trimester     Discharge Instructions     Positive pregancy test  Start prenatal vitamins and make appointment with your obstetrician.    ED Prescriptions    Medication Sig Dispense Auth. Provider   Prenatal Vit-Fe Fumarate-FA (PRENATAL 19) 29-1 MG  CHEW Chew 1 tablet by mouth every morning. 90 tablet Elvina Sidle, MD   ondansetron (ZOFRAN-ODT) 8 MG disintegrating tablet Take 1 tablet (8 mg total) by mouth every 8 (eight) hours as needed for nausea. 12 tablet Elvina Sidle, MD     PDMP not reviewed this encounter.   Elvina Sidle, MD 08/10/19 [phone]

## 2019-09-12 NOTE — L&D Delivery Note (Signed)
Patient was C/C/+1 and pushed for <5 minutes with epidural.    NSVD female infant, Apgars 9/9, weight pending.   The patient had no lacerations requiring suture. Fundus was firm. EBL was expected amount. Placenta was delivered intact. Vagina was clear.  Delayed cord clamping done for 30-60 seconds while warming baby. Baby was vigorous and doing skin to skin with mother.  Philip Aspen

## 2019-09-25 LAB — OB RESULTS CONSOLE RUBELLA ANTIBODY, IGM: Rubella: IMMUNE

## 2019-12-18 ENCOUNTER — Other Ambulatory Visit: Payer: Self-pay | Admitting: Obstetrics

## 2019-12-18 DIAGNOSIS — N6452 Nipple discharge: Secondary | ICD-10-CM

## 2020-02-03 ENCOUNTER — Other Ambulatory Visit: Payer: Self-pay

## 2020-02-03 ENCOUNTER — Ambulatory Visit (HOSPITAL_COMMUNITY)
Admission: EM | Admit: 2020-02-03 | Discharge: 2020-02-03 | Disposition: A | Discharge status: Home / Self Care | Payer: Medicaid Other | Attending: Emergency Medicine | Admitting: Emergency Medicine

## 2020-02-03 ENCOUNTER — Encounter (HOSPITAL_COMMUNITY): Payer: Self-pay

## 2020-02-03 DIAGNOSIS — L02214 Cutaneous abscess of groin: Secondary | ICD-10-CM | POA: Diagnosis not present

## 2020-02-03 MED ORDER — CEPHALEXIN 500 MG PO CAPS: 500.0000 mg | ORAL_CAPSULE | Freq: Four times a day (QID) | ORAL | 0 refills | Status: AC

## 2020-02-03 NOTE — ED Provider Notes (Signed)
[facility]    CSN: [id number] Arrival date & time: 02/03/20  1029      History   Chief Complaint Chief Complaint  Patient presents with  . Abscess    HPI Andrea Lynch is a 24 y.o. female [redacted] weeks pregnant presenting today for evaluation of possible abscess.  Patient reports that she has had an abscess to her right vaginal area for approximately 4 days.  Has history of similar.  Denies fevers, lightheadedness or dizziness.  HPI  Past Medical History:  Diagnosis Date  . Medical history non-contributory     Patient Active Problem List   Diagnosis Date Noted  . Status post normal vaginal delivery 11/20/2014  . Non-reactive NST (non-stress test) 11/18/2014    Past Surgical History:  Procedure Laterality Date  . NO PAST SURGERIES      OB History    Gravida  2   Para  1   Term  1   Preterm      AB      Living  1     SAB      TAB      Ectopic      Multiple  0   Live Births  1            Home Medications    Prior to Admission medications   Medication Sig Start Date End Date Taking? Authorizing Provider  cephALEXin (KEFLEX) 500 MG capsule Take 1 capsule (500 mg total) by mouth 4 (four) times daily for 5 days. 02/03/20 02/08/20  Wieters, Hallie C, PA-C  loratadine (CLARITIN) 10 MG tablet Take 1 tablet (10 mg total) by mouth daily as needed for allergies. 02/23/16   Sudie Grumbling, NP  ondansetron (ZOFRAN-ODT) 8 MG disintegrating tablet Take 1 tablet (8 mg total) by mouth every 8 (eight) hours as needed for nausea. 08/10/19   Elvina Sidle, MD  Prenatal Vit-Fe Fumarate-FA (PRENATAL 19) 29-1 MG CHEW Chew 1 tablet by mouth every morning. 08/10/19   Elvina Sidle, MD    Family History Family History  Problem Relation Age of Onset  . Hypertension Mother   . Hypertension Father   . Allergies Sister     Social History Social History   Tobacco Use  . Smoking status: Never Smoker  . Smokeless tobacco: Never Used  Substance Use  Topics  . Alcohol use: No  . Drug use: No     Allergies   Patient has no known allergies.   Review of Systems Review of Systems  Constitutional: Negative for fatigue and fever.  Eyes: Negative for visual disturbance.  Respiratory: Negative for shortness of breath.   Cardiovascular: Negative for chest pain.  Gastrointestinal: Negative for abdominal pain, nausea and vomiting.  Genitourinary: Positive for genital sores.  Musculoskeletal: Negative for arthralgias and joint swelling.  Skin: Positive for wound. Negative for color change and rash.  Neurological: Negative for dizziness, weakness, light-headedness and headaches.     Physical Exam Triage Vital Signs ED Triage Vitals  Enc Vitals Group     BP 02/03/20 1109 107/69     Pulse Rate 02/03/20 1109 80     Resp 02/03/20 1109 16     Temp 02/03/20 1109 98.3 F (36.8 C)     Temp Source 02/03/20 1109 Oral     SpO2 02/03/20 1109 98 %     Weight --      Height --      Head Circumference --  Peak Flow --      Pain Score 02/03/20 1114 9     Pain Loc --      Pain Edu? --      Excl. in [place]? --    No data found.  Updated Vital Signs BP 107/69 (BP Location: Left Arm)   Pulse 80   Temp 98.3 F (36.8 C) (Oral)   Resp 16   LMP 07/01/2019   SpO2 98%   Visual Acuity Right Eye Distance:   Left Eye Distance:   Bilateral Distance:    Right Eye Near:   Left Eye Near:    Bilateral Near:     Physical Exam Vitals and nursing note reviewed.  Constitutional:      Appearance: She is well-developed.     Comments: No acute distress  HENT:     Head: Normocephalic and atraumatic.     Nose: Nose normal.  Eyes:     Conjunctiva/sclera: Conjunctivae normal.  Cardiovascular:     Rate and Rhythm: Normal rate.  Pulmonary:     Effort: Pulmonary effort is normal. No respiratory distress.  Abdominal:     General: There is no distension.  Genitourinary:    Comments: Right groin/lower labial area with area of induration with  central skin thinning and erythema, spontaneously draining foul-smelling purulent drainage Musculoskeletal:        General: Normal range of motion.     Cervical back: Neck supple.  Skin:    General: Skin is warm and dry.  Neurological:     Mental Status: She is alert and oriented to person, place, and time.      UC Treatments / Results  Labs (all labs ordered are listed, but only abnormal results are displayed) Labs Reviewed - No data to display  EKG   Radiology No results found.  Procedures Procedures (including critical care time)  Medications Ordered in UC Medications - No data to display  Initial Impression / Assessment and Plan / UC Course  I have reviewed the triage vital signs and the nursing notes.  Pertinent labs & imaging results that were available during my care of the patient were reviewed by me and considered in my medical decision making (see chart for details).     Abscess spontaneously drained while in clinic, encouraged more pus out of abscess, recommended continuing warm compresses/soaks with gentle massage, initiate on Keflex.  Monitor for gradual improvement.  Discussed strict return precautions. Patient verbalized understanding and is agreeable with plan.  Final Clinical Impressions(s) / UC Diagnoses   Final diagnoses:  Abscess of groin, right     Discharge Instructions     Please begin keflex x  5 days  Apply warm compresses/hot rags to area with massage to express further drainage especially the first 24-48 hours  Return if symptoms returning or not improving    ED Prescriptions    Medication Sig Dispense Auth. Provider   cephALEXin (KEFLEX) 500 MG capsule Take 1 capsule (500 mg total) by mouth 4 (four) times daily for 5 days. 20 capsule Wieters, [place] C, PA-C     PDMP not reviewed this encounter.   Janith Lima, PA-C 02/03/20 1145

## 2020-02-03 NOTE — ED Triage Notes (Signed)
Pt presents with an abscess on right vaginal area for past few days.

## 2020-02-03 NOTE — Discharge Instructions (Signed)
Please begin keflex x  5 days  Apply warm compresses/hot rags to area with massage to express further drainage especially the first 24-48 hours  Return if symptoms returning or not improving

## 2020-03-07 ENCOUNTER — Inpatient Hospital Stay (HOSPITAL_COMMUNITY)
Admission: AD | Admit: 2020-03-07 | Discharge: 2020-03-08 | Disposition: A | Discharge status: Home / Self Care | Payer: Medicaid Other | Attending: Obstetrics & Gynecology | Admitting: Obstetrics & Gynecology

## 2020-03-07 DIAGNOSIS — O479 False labor, unspecified: Secondary | ICD-10-CM

## 2020-03-07 DIAGNOSIS — M5442 Lumbago with sciatica, left side: Secondary | ICD-10-CM | POA: Insufficient documentation

## 2020-03-07 DIAGNOSIS — Z79899 Other long term (current) drug therapy: Secondary | ICD-10-CM | POA: Insufficient documentation

## 2020-03-07 DIAGNOSIS — M5432 Sciatica, left side: Secondary | ICD-10-CM

## 2020-03-07 DIAGNOSIS — Z3A35 35 weeks gestation of pregnancy: Secondary | ICD-10-CM

## 2020-03-07 DIAGNOSIS — O26893 Other specified pregnancy related conditions, third trimester: Secondary | ICD-10-CM | POA: Insufficient documentation

## 2020-03-07 DIAGNOSIS — Z3689 Encounter for other specified antenatal screening: Secondary | ICD-10-CM

## 2020-03-07 DIAGNOSIS — O288 Other abnormal findings on antenatal screening of mother: Secondary | ICD-10-CM

## 2020-03-07 NOTE — MAU Note (Signed)
Patient reports to MAU for abdominal pain and leg pain that comes and goes. She also reports sharp pain though her vagina. Denies VB/LOF, +movement.

## 2020-03-08 ENCOUNTER — Encounter (HOSPITAL_COMMUNITY): Payer: Self-pay | Admitting: Obstetrics & Gynecology

## 2020-03-08 ENCOUNTER — Other Ambulatory Visit: Payer: Self-pay

## 2020-03-08 DIAGNOSIS — O26893 Other specified pregnancy related conditions, third trimester: Secondary | ICD-10-CM | POA: Diagnosis not present

## 2020-03-08 DIAGNOSIS — O4703 False labor before 37 completed weeks of gestation, third trimester: Secondary | ICD-10-CM

## 2020-03-08 DIAGNOSIS — M5432 Sciatica, left side: Secondary | ICD-10-CM | POA: Diagnosis not present

## 2020-03-08 DIAGNOSIS — Z3A35 35 weeks gestation of pregnancy: Secondary | ICD-10-CM

## 2020-03-08 DIAGNOSIS — Z79899 Other long term (current) drug therapy: Secondary | ICD-10-CM | POA: Diagnosis not present

## 2020-03-08 DIAGNOSIS — M5442 Lumbago with sciatica, left side: Secondary | ICD-10-CM | POA: Diagnosis not present

## 2020-03-08 LAB — URINALYSIS, ROUTINE W REFLEX MICROSCOPIC
Bilirubin Urine: NEGATIVE
Glucose, UA: NEGATIVE mg/dL
Hgb urine dipstick: NEGATIVE
Ketones, ur: NEGATIVE mg/dL
Leukocytes,Ua: NEGATIVE
Nitrite: NEGATIVE
Protein, ur: NEGATIVE mg/dL
Specific Gravity, Urine: 1.002 — ABNORMAL LOW (ref 1.005–1.030)
pH: 7 (ref 5.0–8.0)

## 2020-03-08 MED ORDER — CYCLOBENZAPRINE HCL 10 MG PO TABS
10.0000 mg | ORAL_TABLET | Freq: Two times a day (BID) | ORAL | 0 refills | Status: DC | PRN
Start: 2020-03-08 — End: 2022-07-14

## 2020-03-08 MED ORDER — CYCLOBENZAPRINE HCL 5 MG PO TABS
10.0000 mg | ORAL_TABLET | Freq: Once | ORAL | Status: AC
  Administered 2020-03-08: 10 mg via ORAL
  Filled 2020-03-08: qty 2

## 2020-03-08 MED ORDER — COMFORT FIT MATERNITY SUPP LG MISC
1.0000 [IU] | Freq: Every day | 0 refills | Status: DC
Start: 2020-03-08 — End: 2022-11-11

## 2020-03-08 NOTE — MAU Provider Note (Signed)
History     CSN: [id number]  Arrival date and time: 03/07/20 2336   First Provider Initiated Contact with Patient 03/08/20 0049      Chief Complaint  Patient presents with  . Back Pain   Andrea Lynch is a 24 y.o. G2P1 at [redacted]w[redacted]d who presents to MAU with complaints back pain and leg pain. Patient reports pain started occurring 3 days ago but since has continued to get worse. She describes the back pain as constant in lower back, rates pain 8/10- has not taken any medication for back pain. She reports pain shooting and radiating down the back of her leg specifically on left side. She describes the pain as sharp shooting sensation. She denies abdominal pain, cramping, contractions or tightening. She denies vaginal bleeding, discharge or LOF. +FM.    OB History    Gravida  2   Para  1   Term  1   Preterm      AB      Living  1     SAB      TAB      Ectopic      Multiple  0   Live Births  1           Past Medical History:  Diagnosis Date  . Medical history non-contributory     Past Surgical History:  Procedure Laterality Date  . NO PAST SURGERIES      Family History  Problem Relation Age of Onset  . Hypertension Mother   . Hypertension Father   . Allergies Sister     Social History   Tobacco Use  . Smoking status: Never Smoker  . Smokeless tobacco: Never Used  Vaping Use  . Vaping Use: Never used  Substance Use Topics  . Alcohol use: No  . Drug use: No    Allergies: No Known Allergies  Medications Prior to Admission  Medication Sig Dispense Refill Last Dose  . ondansetron (ZOFRAN-ODT) 8 MG disintegrating tablet Take 1 tablet (8 mg total) by mouth every 8 (eight) hours as needed for nausea. 12 tablet 0 03/07/2020 at Unknown time  . Prenatal Vit-Fe Fumarate-FA (PRENATAL 19) 29-1 MG CHEW Chew 1 tablet by mouth every morning. 90 tablet 3 03/07/2020 at Unknown time  . loratadine (CLARITIN) 10 MG tablet Take 1 tablet (10 mg total) by mouth daily as  needed for allergies. 30 tablet 0     Review of Systems  Constitutional: Negative.   Respiratory: Negative.   Cardiovascular: Negative.   Gastrointestinal: Negative.   Genitourinary: Negative.   Musculoskeletal: Positive for back pain.  Neurological: Negative.   Psychiatric/Behavioral: Negative.    Physical Exam   Blood pressure 115/64, pulse 86, temperature 98.6 F (37 C), temperature source Oral, resp. rate 16, weight 90.9 kg, last menstrual period 07/01/2019, unknown if currently breastfeeding.  Physical Exam Vitals reviewed.  HENT:     Head: Normocephalic.  Cardiovascular:     Rate and Rhythm: Normal rate and regular rhythm.  Pulmonary:     Effort: Pulmonary effort is normal. No respiratory distress.     Breath sounds: Normal breath sounds. No wheezing.  Abdominal:     Palpations: Abdomen is soft. There is no mass.     Tenderness: There is no abdominal tenderness. There is no right CVA tenderness, left CVA tenderness or guarding.     Comments: Gravid appropriate for gestational age, mild contractions palpated   Skin:    General: Skin is warm and dry.  Neurological:     Mental Status: She is alert and oriented to person, place, and time.  Psychiatric:        Mood and Affect: Mood normal.        Thought Content: Thought content normal.    Dilation: Closed Effacement (%): Thick Cervical Position: Posterior Presentation: Vertex Exam by:: V Trice Aspinall CNM  Fetal monitoring:  FHR- 135/moderate/+accels/ no decel  TOCO- 6-9 minutes, mild by palpation   MAU Course  Procedures  MDM Orders Placed This Encounter  Procedures  . Urinalysis, Routine w reflex microscopic  . Encourage/Reinforce Importance Po Fluids   Results for orders placed or performed during the hospital encounter of 03/07/20 (from the past 24 hour(s))  Urinalysis, Routine w reflex microscopic     Status: Abnormal   Collection Time: 03/08/20 12:25 AM  Result Value Ref Range   Color, Urine STRAW (A)  YELLOW   APPearance HAZY (A) CLEAR   Specific Gravity, Urine 1.002 (L) 1.005 - 1.030   pH 7.0 5.0 - 8.0   Glucose, UA NEGATIVE NEGATIVE mg/dL   Hgb urine dipstick NEGATIVE NEGATIVE   Bilirubin Urine NEGATIVE NEGATIVE   Ketones, ur NEGATIVE NEGATIVE mg/dL   Protein, ur NEGATIVE NEGATIVE mg/dL   Nitrite NEGATIVE NEGATIVE   Leukocytes,Ua NEGATIVE NEGATIVE   Cervical examination - closed/thick  Treatments in MAU including Flexeril 10mg  PO x1 dose and hydration   Meds ordered this encounter  Medications  . cyclobenzaprine (FLEXERIL) tablet 10 mg  . Elastic Bandages & Supports (COMFORT FIT MATERNITY SUPP LG) MISC    Sig: 1 Units by Does not apply route daily.    Dispense:  1 each    Refill:  0    Order Specific Question:   Supervising Provider    Answer:   Jonnie Kind [[id number]]  . cyclobenzaprine (FLEXERIL) 10 MG tablet    Sig: Take 1 tablet (10 mg total) by mouth 2 (two) times daily as needed for muscle spasms.    Dispense:  10 tablet    Refill:  0    Order Specific Question:   Supervising Provider    Answer:   Jonnie Kind [phone]   Reassessment @ 0200 - patient reports pain is resolved, rates 0/10- patient reports pain only occurs now when she changes positions or sits up  Educated and discussed sciatic nerve pain during pregnancy, discussed use of maternity support belt and flexeril.  Cervical rechecked and no cervical change  NST reactive and reassuring   Rx for maternity support belt given to patient, Rx for flexeril sent to pharmacy of choice. Discussed reasons to return to MAU. Follow up as scheduled in the office. Return to MAU as needed. Pt stable at time of discharge.   Assessment and Plan   1. Back pain with left-sided sciatica   2. Braxton Hick's contraction   3. [redacted] weeks gestation of pregnancy   4. NST (non-stress test) reactive    Discharge home Follow up as scheduled in the office for prenatal care Return to MAU as needed for reasons discussed and/or  emergencies  Rx for maternity support belt and Flexeril    Follow-up Information    Ob/[facility]yn, [facility] Follow up.   Why: Follow up as scheduled for prenatal care and return to MAU as needed  Contact information: [place] [address] [phone]              Allergies as of 03/08/2020   No Known  Allergies     Medication List    TAKE these medications   Comfort Fit Maternity Supp Lg Misc 1 Units by Does not apply route daily.   cyclobenzaprine 10 MG tablet Commonly known as: FLEXERIL Take 1 tablet (10 mg total) by mouth 2 (two) times daily as needed for muscle spasms.   loratadine 10 MG tablet Commonly known as: CLARITIN Take 1 tablet (10 mg total) by mouth daily as needed for allergies.   ondansetron 8 MG disintegrating tablet Commonly known as: ZOFRAN-ODT Take 1 tablet (8 mg total) by mouth every 8 (eight) hours as needed for nausea.   Prenatal 19 29-1 MG Chew Chew 1 tablet by mouth every morning.       Sharyon Cable CNM 03/08/2020, 2:05 AM

## 2020-03-08 NOTE — Discharge Instructions (Signed)

## 2020-03-27 ENCOUNTER — Inpatient Hospital Stay (HOSPITAL_COMMUNITY): Payer: Medicaid Other | Admitting: Anesthesiology

## 2020-03-27 ENCOUNTER — Inpatient Hospital Stay (HOSPITAL_COMMUNITY)
Admission: AD | Admit: 2020-03-27 | Discharge: 2020-03-29 | DRG: 807 | Disposition: A | Discharge status: Home / Self Care | Payer: Medicaid Other | Attending: Obstetrics and Gynecology | Admitting: Obstetrics and Gynecology

## 2020-03-27 ENCOUNTER — Inpatient Hospital Stay (HOSPITAL_BASED_OUTPATIENT_CLINIC_OR_DEPARTMENT_OTHER): Payer: Medicaid Other

## 2020-03-27 ENCOUNTER — Encounter (HOSPITAL_COMMUNITY): Payer: Self-pay | Admitting: Obstetrics and Gynecology

## 2020-03-27 ENCOUNTER — Other Ambulatory Visit: Payer: Self-pay

## 2020-03-27 DIAGNOSIS — Z0371 Encounter for suspected problem with amniotic cavity and membrane ruled out: Secondary | ICD-10-CM | POA: Diagnosis not present

## 2020-03-27 DIAGNOSIS — O368990 Maternal care for other specified fetal problems, unspecified trimester, not applicable or unspecified: Secondary | ICD-10-CM

## 2020-03-27 DIAGNOSIS — O99214 Obesity complicating childbirth: Principal | ICD-10-CM | POA: Diagnosis present

## 2020-03-27 DIAGNOSIS — Z3A38 38 weeks gestation of pregnancy: Secondary | ICD-10-CM | POA: Diagnosis not present

## 2020-03-27 DIAGNOSIS — Z20822 Contact with and (suspected) exposure to covid-19: Secondary | ICD-10-CM | POA: Diagnosis present

## 2020-03-27 DIAGNOSIS — E669 Obesity, unspecified: Secondary | ICD-10-CM | POA: Diagnosis present

## 2020-03-27 DIAGNOSIS — O26893 Other specified pregnancy related conditions, third trimester: Secondary | ICD-10-CM | POA: Diagnosis present

## 2020-03-27 HISTORY — DX: Herpesviral infection, unspecified: B00.9

## 2020-03-27 LAB — CBC
HCT: 32.1 % — ABNORMAL LOW (ref 36.0–46.0)
Hemoglobin: 9.9 g/dL — ABNORMAL LOW (ref 12.0–15.0)
MCH: 26.7 pg (ref 26.0–34.0)
MCHC: 30.8 g/dL (ref 30.0–36.0)
MCV: 86.5 fL (ref 80.0–100.0)
Platelets: 339 10*3/uL (ref 150–400)
RBC: 3.71 MIL/uL — ABNORMAL LOW (ref 3.87–5.11)
RDW: 15.1 % (ref 11.5–15.5)
WBC: 12.6 10*3/uL — ABNORMAL HIGH (ref 4.0–10.5)
nRBC: 0 % (ref 0.0–0.2)

## 2020-03-27 LAB — TYPE AND SCREEN
ABO/RH(D): O POS
Antibody Screen: NEGATIVE

## 2020-03-27 LAB — POCT FERN TEST: POCT Fern Test: NEGATIVE

## 2020-03-27 LAB — RPR: RPR Ser Ql: NONREACTIVE

## 2020-03-27 LAB — SARS CORONAVIRUS 2 BY RT PCR (HOSPITAL ORDER, PERFORMED IN ~~LOC~~ HOSPITAL LAB): SARS Coronavirus 2: NEGATIVE

## 2020-03-27 MED ORDER — OXYTOCIN-SODIUM CHLORIDE 30-0.9 UT/500ML-% IV SOLN: 2.5000 [IU]/h | INTRAVENOUS | Status: DC

## 2020-03-27 MED ORDER — ONDANSETRON HCL 4 MG PO TABS: 4.0000 mg | ORAL_TABLET | ORAL | Status: DC | PRN

## 2020-03-27 MED ORDER — LACTATED RINGERS IV SOLN
500.0000 mL | Freq: Once | INTRAVENOUS | Status: AC
  Administered 2020-03-27: 500 mL via INTRAVENOUS

## 2020-03-27 MED ORDER — ONDANSETRON HCL 4 MG/2ML IJ SOLN: 4.0000 mg | Freq: Four times a day (QID) | INTRAMUSCULAR | Status: DC | PRN

## 2020-03-27 MED ORDER — FLEET ENEMA 7-19 GM/118ML RE ENEM: 1.0000 | ENEMA | RECTAL | Status: DC | PRN

## 2020-03-27 MED ORDER — DIPHENHYDRAMINE HCL 25 MG PO CAPS: 25.0000 mg | ORAL_CAPSULE | Freq: Four times a day (QID) | ORAL | Status: DC | PRN

## 2020-03-27 MED ORDER — COCONUT OIL OIL: 1.0000 "application " | TOPICAL_OIL | Status: DC | PRN

## 2020-03-27 MED ORDER — EPHEDRINE 5 MG/ML INJ: 10.0000 mg | INTRAVENOUS | Status: DC | PRN

## 2020-03-27 MED ORDER — OXYTOCIN-SODIUM CHLORIDE 30-0.9 UT/500ML-% IV SOLN
1.0000 m[IU]/min | INTRAVENOUS | Status: DC
  Administered 2020-03-27: 2 m[IU]/min via INTRAVENOUS
  Filled 2020-03-27: qty 500

## 2020-03-27 MED ORDER — IBUPROFEN 600 MG PO TABS
600.0000 mg | ORAL_TABLET | Freq: Four times a day (QID) | ORAL | Status: DC
  Administered 2020-03-27 – 2020-03-29 (×7): 600 mg via ORAL
  Filled 2020-03-27 (×7): qty 1

## 2020-03-27 MED ORDER — OXYTOCIN BOLUS FROM INFUSION
333.0000 mL | Freq: Once | INTRAVENOUS | Status: AC
  Administered 2020-03-27: 333 mL via INTRAVENOUS

## 2020-03-27 MED ORDER — OXYCODONE-ACETAMINOPHEN 5-325 MG PO TABS: 2.0000 | ORAL_TABLET | ORAL | Status: DC | PRN

## 2020-03-27 MED ORDER — OXYCODONE-ACETAMINOPHEN 5-325 MG PO TABS: 1.0000 | ORAL_TABLET | ORAL | Status: DC | PRN

## 2020-03-27 MED ORDER — TERBUTALINE SULFATE 1 MG/ML IJ SOLN: 0.2500 mg | Freq: Once | INTRAMUSCULAR | Status: DC | PRN

## 2020-03-27 MED ORDER — ACETAMINOPHEN 325 MG PO TABS
650.0000 mg | ORAL_TABLET | ORAL | Status: DC | PRN
  Administered 2020-03-27: 650 mg via ORAL
  Filled 2020-03-27: qty 2

## 2020-03-27 MED ORDER — DIBUCAINE (PERIANAL) 1 % EX OINT: 1.0000 "application " | TOPICAL_OINTMENT | CUTANEOUS | Status: DC | PRN

## 2020-03-27 MED ORDER — PHENYLEPHRINE 40 MCG/ML (10ML) SYRINGE FOR IV PUSH (FOR BLOOD PRESSURE SUPPORT): 80.0000 ug | PREFILLED_SYRINGE | INTRAVENOUS | Status: DC | PRN

## 2020-03-27 MED ORDER — SIMETHICONE 80 MG PO CHEW: 80.0000 mg | CHEWABLE_TABLET | ORAL | Status: DC | PRN

## 2020-03-27 MED ORDER — BENZOCAINE-MENTHOL 20-0.5 % EX AERO
1.0000 "application " | INHALATION_SPRAY | CUTANEOUS | Status: DC | PRN
  Administered 2020-03-27: 1 via TOPICAL
  Filled 2020-03-27: qty 56

## 2020-03-27 MED ORDER — ONDANSETRON HCL 4 MG/2ML IJ SOLN: 4.0000 mg | INTRAMUSCULAR | Status: DC | PRN

## 2020-03-27 MED ORDER — SOD CITRATE-CITRIC ACID 500-334 MG/5ML PO SOLN: 30.0000 mL | ORAL | Status: DC | PRN

## 2020-03-27 MED ORDER — ZOLPIDEM TARTRATE 5 MG PO TABS: 5.0000 mg | ORAL_TABLET | Freq: Every evening | ORAL | Status: DC | PRN

## 2020-03-27 MED ORDER — SENNOSIDES-DOCUSATE SODIUM 8.6-50 MG PO TABS
2.0000 | ORAL_TABLET | ORAL | Status: DC
  Administered 2020-03-27: 2 via ORAL
  Filled 2020-03-27 (×2): qty 2

## 2020-03-27 MED ORDER — TETANUS-DIPHTH-ACELL PERTUSSIS 5-2.5-18.5 LF-MCG/0.5 IM SUSP: 0.5000 mL | Freq: Once | INTRAMUSCULAR | Status: DC

## 2020-03-27 MED ORDER — ACETAMINOPHEN 325 MG PO TABS: 650.0000 mg | ORAL_TABLET | ORAL | Status: DC | PRN

## 2020-03-27 MED ORDER — DIPHENHYDRAMINE HCL 50 MG/ML IJ SOLN: 12.5000 mg | INTRAMUSCULAR | Status: DC | PRN

## 2020-03-27 MED ORDER — LACTATED RINGERS IV SOLN: INTRAVENOUS | Status: DC

## 2020-03-27 MED ORDER — WITCH HAZEL-GLYCERIN EX PADS: 1.0000 "application " | MEDICATED_PAD | CUTANEOUS | Status: DC | PRN

## 2020-03-27 MED ORDER — PHENYLEPHRINE 40 MCG/ML (10ML) SYRINGE FOR IV PUSH (FOR BLOOD PRESSURE SUPPORT)
80.0000 ug | PREFILLED_SYRINGE | INTRAVENOUS | Status: DC | PRN
  Filled 2020-03-27: qty 10

## 2020-03-27 MED ORDER — LACTATED RINGERS IV SOLN: 500.0000 mL | INTRAVENOUS | Status: DC | PRN

## 2020-03-27 MED ORDER — LIDOCAINE HCL (PF) 1 % IJ SOLN: 30.0000 mL | INTRAMUSCULAR | Status: DC | PRN

## 2020-03-27 MED ORDER — PRENATAL MULTIVITAMIN CH
1.0000 | ORAL_TABLET | Freq: Every day | ORAL | Status: DC
  Administered 2020-03-28 – 2020-03-29 (×2): 1 via ORAL
  Filled 2020-03-27 (×2): qty 1

## 2020-03-27 MED ORDER — FENTANYL-BUPIVACAINE-NACL 0.5-0.125-0.9 MG/250ML-% EP SOLN
12.0000 mL/h | EPIDURAL | Status: DC | PRN
  Filled 2020-03-27: qty 250

## 2020-03-27 NOTE — H&P (Signed)
24 y.o. [redacted]w[redacted]d  G2P1001 comes in c/o contractions and leaking fluid.  Otherwise has good fetal movement and no bleeding.  In MAU cervix unchanged and fern negative, however per RN multiple variables noted.  Advised to check AFI and I would review strip.  Upon review of strip, frequent deep variable noted, necessitating admission for monitoring and IOL.  Past Medical History:  Diagnosis Date  . Medical history non-contributory     Past Surgical History:  Procedure Laterality Date  . NO PAST SURGERIES      OB History  Gravida Para Term Preterm AB Living  2 1 1     1   SAB TAB Ectopic Multiple Live Births        0 1    # Outcome Date GA Lbr Len/2nd Weight Sex Delivery Anes PTL Lv  2 Current           1 Term 11/19/14 [redacted]w[redacted]d 26:54 3240 g M Vag-Spont EPI  LIV    Social History   Socioeconomic History  . Marital status: Single    Spouse name: n/a  . Number of children: 0  . Years of education: Not on file  . Highest education level: Not on file  Occupational History  . Occupation: cashier/dining room/expedite    Employer: [redacted]w[redacted]d  . Occupation: student    Comment: [employer]  Tobacco Use  . Smoking status: Never Smoker  . Smokeless tobacco: Never Used  Vaping Use  . Vaping Use: Never used  Substance and Sexual Activity  . Alcohol use: No  . Drug use: No  . Sexual activity: Yes    Birth control/protection: None  Other Topics Concern  . Not on file  Social History Narrative   Lives with her father and his wife.    Social Determinants of Health   Financial Resource Strain:   . Difficulty of Paying Living Expenses:   Food Insecurity:   . Worried About Actor in the Last Year:   . Programme researcher, broadcasting/film/video in the Last Year:   Transportation Needs:   . Barista (Medical):   Freight forwarder Lack of Transportation (Non-Medical):   Physical Activity:   . Days of Exercise per Week:   . Minutes of Exercise per Session:   Stress:   . Feeling of Stress :   Social  Connections:   . Frequency of Communication with Friends and Family:   . Frequency of Social Gatherings with Friends and Family:   . Attends Religious Services:   . Active Member of Clubs or Organizations:   . Attends Marland Kitchen Meetings:   Banker Marital Status:   Intimate Partner Violence:   . Fear of Current or Ex-Partner:   . Emotionally Abused:   [employer] Physically Abused:   . Sexually Abused:    Patient has no known allergies.    Prenatal Transfer Tool  Maternal Diabetes: No Genetic Screening: Normal Maternal Ultrasounds/Referrals: Normal Fetal Ultrasounds or other Referrals:  None Maternal Substance Abuse:  No Significant Maternal Medications:  None Significant Maternal Lab Results: Group B Strep negative  Other PNC: uncomplicated.    Vitals:   03/27/20 0623 03/27/20 0643  BP: 118/67 121/74  Pulse: 79 79  Resp: 20   Temp: 98.3 F (36.8 C)   TempSrc: Oral   Weight: 92.4 kg   Height: 5\' 2"  (1.575 m)     Lungs/Cor:  NAD Abdomen:  soft, gravid Ex:  no cords, erythema SVE:  3/80/B FHTs:  145, good STV, 10x10 accels with frequent deep variables Toco:  irregular   A/P   Admit at 38.4 for IOL given frequent variable decelerations  GBS Neg  Will plan to recheck cervix in a few hours, if unchanged will start pitocin if able  Philip Aspen

## 2020-03-27 NOTE — OB Triage Provider Note (Addendum)
S: Ms. Andrea Lynch is a 24 y.o. G2P1001 at [redacted]w[redacted]d  who presents to MAU today complaining of leaking of fluid since 0500. She denies vaginal bleeding. She endorses contractions. She reports normal fetal movement.    O: BP 121/74   Pulse 79   Temp 98.3 F (36.8 C) (Oral)   Resp 20   Ht 5\' 2"  (1.575 m)   Wt 92.4 kg   LMP 07/01/2019   BMI 37.26 kg/m  GENERAL: Well-developed, well-nourished female in no acute distress.  HEAD: Normocephalic, atraumatic.  CHEST: Normal effort of breathing, regular heart rate ABDOMEN: Soft, nontender, gravid PELVIC: Normal external female genitalia. Vagina is pink and rugated. Normal discharge.  Negative pooling. Cervix with normal contour, no lesions. Small amt mucoid discharge from os. No active leaking or leakage with valsalva maneuver.  Fern Collected.  Cervical exam:  Dilation: 3 Effacement (%): 80 Cervical Position: Posterior Station: Ballotable Presentation: Vertex Exam by:: [email or web address], RN   Fetal Monitoring: FHT: 120 bpm, Mod Var, +Decels, +Accels Toco: Q2-85min  Results for orders placed or performed during the hospital encounter of 03/27/20 (from the past 24 hour(s))  POCT fern test     Status: None   Collection Time: 03/27/20  7:20 AM  Result Value Ref Range   POCT Fern Test Negative = intact amniotic membranes      A: SIUP at [redacted]w[redacted]d  Membranes intact  Cat II FT  P: -Cat II FT resolved spontaneously.  -Patient with deceleration with position change from semi-fowler to dorsal recumbent after contraction.  -Exam performed and patient informed that findings minimally suspicious for SROM. [redacted]w[redacted]d reviewed and confirms membranes intact. -Nurse performs VE and as above. -Instructed to monitor for 1.5 hours and reassess.  -Report to be given to C. Crist Fat for continued observation and admission or discharge as appropriate.  [name], CNM 03/27/2020 7:27 AM   Cervix unchanged after 1.5 hours, but still having variables on FHR.  Reviewed FHR tracing with Dr. 03/29/2020- recommends calling Dr. [name] for management. Reviewed tracing with [name] who recommends [name] with BPP/AFI.   [name] BPP  [name] called unit stating she has reviewed FHR tracing and recommends admission for IOL.   BPP results- 6/8, off for breathing Care turned over to Dr. 8/8 for further management.   [name], CNM 03/27/20 10:19 AM

## 2020-03-27 NOTE — MAU Note (Signed)
PT SAYS SROM AT 0532- CLEAR. UC STARTED  BEFORE. VE IN OFFICE  1 CM. 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 TAKES VALTREX. UNSURE OF GBS

## 2020-03-27 NOTE — Progress Notes (Signed)
RN informed me as I was proceeding to the OR with another pt that patient reported her water broke.  Variable decelerations were noted but moderate variability.  I assessed pt to place IUPC and felt large bulging bag, suspected that water had not broken prior.  I advised RN to d/c pitocin and asked faculty doctor to observe strip while I was in OR, she readily agreed. While in OR I was able to glance at the strip occasionally and variable decels appeared to have resolved. I returned to assess the patient at the conclusion of my OR case.  As I was feeling for the fetal head the amniontic sac ruptured with clear fluid and baby's head was felt to be 0/ +1 station SVE: 8/100/0 +1 TOCO: 2-3 Currently FHT : 130 var +accels, no decels Anticipate vaginal delivery shortly.

## 2020-03-27 NOTE — Anesthesia Procedure Notes (Signed)
Epidural Patient location during procedure: OB Start time: 03/27/2020 11:53 AM End time: 03/27/2020 12:01 PM  Staffing Anesthesiologist: Mal Amabile, MD Performed: anesthesiologist   Preanesthetic Checklist Completed: patient identified, IV checked, site marked, risks and benefits discussed, surgical consent, monitors and equipment checked, pre-op evaluation and timeout performed  Epidural Patient position: sitting Prep: DuraPrep and site prepped and draped Patient monitoring: continuous pulse ox and blood pressure Approach: midline Location: L3-L4 Injection technique: LOR air  Needle:  Needle type: Tuohy  Needle gauge: 17 G Needle length: 9 cm and 9 Needle insertion depth: 7 cm Catheter type: closed end flexible Catheter size: 19 Gauge Catheter at skin depth: 12 cm Test dose: negative and Other  Assessment Events: blood not aspirated, injection not painful, no injection resistance, no paresthesia and negative IV test  Additional Notes Patient identified. Risks and benefits discussed including failed block, incomplete  Pain control, post dural puncture headache, nerve damage, paralysis, blood pressure Changes, nausea, vomiting, reactions to medications-both toxic and allergic and post Partum back pain. All questions were answered. Patient expressed understanding and wished to proceed. Sterile technique was used throughout procedure. Epidural site was Dressed with sterile barrier dressing. No paresthesias, signs of intravascular injection Or signs of intrathecal spread were encountered.  Patient was more comfortable after the epidural was dosed. Please see RN's note for documentation of vital signs and FHR which are stable. Reason for block:procedure for pain

## 2020-03-27 NOTE — Anesthesia Preprocedure Evaluation (Signed)
Anesthesia Evaluation  Patient identified by MRN, date of birth, ID band Patient awake    Reviewed: Allergy & Precautions, Patient's Chart, lab work & pertinent test results  Airway Mallampati: II  TM Distance: >3 FB Neck ROM: Full    Dental no notable dental hx. (+) Teeth Intact   Pulmonary neg pulmonary ROS,    Pulmonary exam normal breath sounds clear to auscultation       Cardiovascular negative cardio ROS Normal cardiovascular exam Rhythm:Regular Rate:Normal     Neuro/Psych negative neurological ROS  negative psych ROS   GI/Hepatic Neg liver ROS, GERD  ,  Endo/Other  Obesity  Renal/GU negative Renal ROS  negative genitourinary   Musculoskeletal negative musculoskeletal ROS (+)   Abdominal (+) + obese,   Peds  Hematology  (+) anemia ,   Anesthesia Other Findings   Reproductive/Obstetrics (+) Pregnancy                             Anesthesia Physical Anesthesia Plan  ASA: II  Anesthesia Plan: Epidural   Post-op Pain Management:    Induction:   PONV Risk Score and Plan:   Airway Management Planned: Natural Airway  Additional Equipment:   Intra-op Plan:   Post-operative Plan:   Informed Consent: I have reviewed the patients History and Physical, chart, labs and discussed the procedure including the risks, benefits and alternatives for the proposed anesthesia with the patient or authorized representative who has indicated his/her understanding and acceptance.       Plan Discussed with: Anesthesiologist  Anesthesia Plan Comments:         Anesthesia Quick Evaluation  

## 2020-03-28 LAB — CBC
HCT: 29.4 % — ABNORMAL LOW (ref 36.0–46.0)
Hemoglobin: 9.4 g/dL — ABNORMAL LOW (ref 12.0–15.0)
MCH: 27.7 pg (ref 26.0–34.0)
MCHC: 32 g/dL (ref 30.0–36.0)
MCV: 86.7 fL (ref 80.0–100.0)
Platelets: 323 10*3/uL (ref 150–400)
RBC: 3.39 MIL/uL — ABNORMAL LOW (ref 3.87–5.11)
RDW: 15.2 % (ref 11.5–15.5)
WBC: 14.7 10*3/uL — ABNORMAL HIGH (ref 4.0–10.5)
nRBC: 0 % (ref 0.0–0.2)

## 2020-03-28 MED ORDER — FERROUS GLUCONATE 324 (38 FE) MG PO TABS
324.0000 mg | ORAL_TABLET | Freq: Every day | ORAL | Status: DC
  Administered 2020-03-29: 324 mg via ORAL
  Filled 2020-03-28: qty 1

## 2020-03-28 MED ORDER — LIDOCAINE HCL (PF) 1 % IJ SOLN
INTRAMUSCULAR | Status: DC | PRN
  Administered 2020-03-27 (×2): 4 mL via EPIDURAL

## 2020-03-28 MED ORDER — SODIUM CHLORIDE (PF) 0.9 % IJ SOLN
INTRAMUSCULAR | Status: DC | PRN
  Administered 2020-03-27: 12 mL/h via EPIDURAL

## 2020-03-28 NOTE — Progress Notes (Signed)
Patient is eating, ambulating, voiding.  Pain control is good.  Appropriate lochia, no complaints.  Vitals:   03/27/20 2005 03/27/20 2100 03/28/20 0100 03/28/20 0516  BP: 109/72 120/72 120/75 103/68  Pulse: 74 77 77 64  Resp: 18 18 18 18   Temp: 98.6 F (37 C) 98.2 F (36.8 C) 98.3 F (36.8 C) 98.1 F (36.7 C)  TempSrc: Oral Oral Oral Oral  SpO2: 100% 99% 100% 99%  Weight:      Height:        Fundus firm Abd: nontender Ext: no calf tenderness  Lab Results  Component Value Date   WBC 14.7 (H) 03/28/2020   HGB 9.4 (L) 03/28/2020   HCT 29.4 (L) 03/28/2020   MCV 86.7 03/28/2020   PLT 323 03/28/2020    --/--/O POS (07/17 1000)  A/P Post partum 1 Postpartum anemia- iron daily  Routine care.  Expect d/c 7/19.    8/19

## 2020-03-28 NOTE — Anesthesia Postprocedure Evaluation (Signed)
Anesthesia Post Note  Patient: Andrea Lynch  Procedure(s) Performed: AN AD HOC LABOR EPIDURAL     Patient location during evaluation: Mother Baby Anesthesia Type: Epidural Level of consciousness: awake and alert Pain management: pain level controlled Vital Signs Assessment: post-procedure vital signs reviewed and stable Respiratory status: spontaneous breathing, nonlabored ventilation and respiratory function stable Cardiovascular status: stable Postop Assessment: no headache, no backache, epidural receding, no apparent nausea or vomiting, patient able to bend at knees, adequate PO intake and able to ambulate Anesthetic complications: no   No complications documented.  Last Vitals:  Vitals:   03/28/20 0100 03/28/20 0516  BP: 120/75 103/68  Pulse: 77 64  Resp: 18 18  Temp: 36.8 C 36.7 C  SpO2: 100% 99%    Last Pain:  Vitals:   03/28/20 0629  TempSrc:   PainSc: 3    Pain Goal:                   Laban Emperor

## 2020-03-28 NOTE — Progress Notes (Signed)
CSW received consult for hx of marijuana use.  Referral was screened out due to the following: ~MOB had no documented substance use after initial prenatal visit/+UPT. ~MOB had no positive drug screens after initial prenatal visit/+UPT. ~Baby's UDS is negative.  Please consult CSW if current concerns arise or by MOB's request.  CSW will monitor CDS results and make report to Child Protective Services if warranted.  Babette Stum D. Glendell Schlottman, MSW, LCSW Clinical Social Worker [phone] 

## 2020-03-29 MED ORDER — ACETAMINOPHEN 325 MG PO TABS: 650.0000 mg | ORAL_TABLET | ORAL | 1 refills | Status: DC | PRN

## 2020-03-29 MED ORDER — OXYCODONE-ACETAMINOPHEN 5-325 MG PO TABS: 2.0000 | ORAL_TABLET | ORAL | 0 refills | Status: DC | PRN

## 2020-03-29 MED ORDER — FERROUS SULFATE 325 (65 FE) MG PO TABS: 325.0000 mg | ORAL_TABLET | Freq: Every day | ORAL | 11 refills | Status: DC

## 2020-03-29 MED ORDER — IBUPROFEN 600 MG PO TABS: 600.0000 mg | ORAL_TABLET | Freq: Four times a day (QID) | ORAL | 1 refills | Status: DC

## 2020-03-29 MED ORDER — DOCUSATE SODIUM 50 MG PO CAPS
50.0000 mg | ORAL_CAPSULE | Freq: Two times a day (BID) | ORAL | 0 refills | Status: DC
Start: 2020-03-29 — End: 2022-11-11

## 2020-03-29 NOTE — Progress Notes (Signed)
Post Partum Day 2 Subjective: no complaints, up ad lib, voiding and tolerating PO  Objective: Patient Vitals for the past 24 hrs:  BP Temp Temp src Pulse Resp SpO2  03/29/20 0548 96/69 98.2 F (36.8 C) Oral (!) 57 16 100 %  03/28/20 2114 118/79 98.2 F (36.8 C) Oral 64 16 99 %  03/28/20 1350 110/74 97.9 F (36.6 C) Oral 69 17 100 %    Physical Exam:  General: alert and no distress Lochia: appropriate Uterine Fundus: firm DVT Evaluation: No evidence of DVT seen on physical exam.  Recent Labs    03/27/20 1000 03/28/20 0413  WBC 12.6* 14.7*  HGB 9.9* 9.4*  HCT 32.1* 29.4*  PLT 339 323   Assessment/Plan: Discharge home  Andrea Lynch 23 y.o. [id number] PPD#2 sp SVD at [redacted]w[redacted]d  1. PPC: doing well, plan for discharge with PO iron 2. Contraception: discussed options, has tried nexplanon, IUD, pills, considering nexplanon at postpartum visit 3. Desires neonatal circumcision, R/B/A of procedure discussed at length. Pt understands that neonatal circumcision is not considered medically necessary and is elective. The risks include, but are not limited to bleeding, infection, damage to the penis, development of scar tissue, and having to have it redone at a later date. Pt understands theses risks and wishes to proceed, plan for circ today Rubella immune, blood type O POS, formula feeding, baby boy in room   LOS: 2 days   Andrea Lynch K Taam-Akelman 03/29/2020, 9:24 AM

## 2020-03-29 NOTE — Discharge Summary (Signed)
Postpartum Discharge Summary  Patient Name: Andrea Lynch DOB: [birth year] MRN: [id number]  Date of admission: 03/27/2020 Delivery date:03/27/2020  Delivering provider: Allyn Kenner  Date of discharge: 03/29/2020  Admitting diagnosis: Normal labor [O80, Z37.9] Intrauterine pregnancy: [redacted]w[redacted]d    Secondary diagnosis:  Active Problems:   Normal labor  Additional problems: none    Discharge diagnosis: Term Pregnancy Delivered                                              Post partum procedures:none Augmentation: Pitocin Complications: None  Hospital course: Onset of Labor With Vaginal Delivery      24y.o. yo [id number] admitted in latent labor with variables on 03/27/2020. Patient had an uncomplicated labor course as follows:  Membrane Rupture Time/Date: 3:55 PM ,03/27/2020   Delivery Method:Vaginal, Spontaneous  Episiotomy: None  Lacerations:  Labial  Patient had an uncomplicated postpartum course.  She is ambulating, tolerating a regular diet, passing flatus, and urinating well. Patient is discharged home in stable condition on 03/29/20.  Newborn Data: Birth date:03/27/2020  Birth time:5:30 PM  Gender:Female  Living status:Living  Apgars:9 ,9  Weight:3235 g   Magnesium Sulfate received: No BMZ received: No Rhophylac:N/A MMR:N/A T-DaP:Given prenatally Transfusion:No  Physical exam  Vitals:   03/28/20 0920 03/28/20 1350 03/28/20 2114 03/29/20 0548  BP: 112/80 110/74 118/79 96/69  Pulse: 65 69 64 (!) 57  Resp: '16 17 16 16  '$ Temp: 98 F (36.7 C) 97.9 F (36.6 C) 98.2 F (36.8 C) 98.2 F (36.8 C)  TempSrc: Oral Oral Oral Oral  SpO2: 100% 100% 99% 100%  Weight:      Height:       General: alert and no distress Lochia: appropriate Uterine Fundus: firm DVT Evaluation: No evidence of DVT seen on physical exam. Labs: Lab Results  Component Value Date   WBC 14.7 (H) 03/28/2020   HGB 9.4 (L) 03/28/2020   HCT 29.4 (L) 03/28/2020   MCV 86.7 03/28/2020    PLT 323 03/28/2020   No flowsheet data found. Edinburgh Score: Edinburgh Postnatal Depression Scale Screening Tool 03/28/2020  I have been able to laugh and see the funny side of things. 0  I have looked forward with enjoyment to things. 0  I have blamed myself unnecessarily when things went wrong. 0  I have been anxious or worried for no good reason. 0  I have felt scared or panicky for no good reason. 0  Things have been getting on top of me. 0  I have been so unhappy that I have had difficulty sleeping. 0  I have felt sad or miserable. 0  I have been so unhappy that I have been crying. 0  The thought of harming myself has occurred to me. 0  Edinburgh Postnatal Depression Scale Total 0      After visit meds:  Allergies as of 03/29/2020   No Known Allergies     Medication List    STOP taking these medications   valACYclovir 500 MG tablet Commonly known as: VALTREX     TAKE these medications   acetaminophen 325 MG tablet Commonly known as: Tylenol Take 2 tablets (650 mg total) by mouth every 4 (four) hours as needed (for pain scale < 4).   Comfort Fit Maternity Supp Lg Misc 1 Units by Does not apply route  daily.   cyclobenzaprine 10 MG tablet Commonly known as: FLEXERIL Take 1 tablet (10 mg total) by mouth 2 (two) times daily as needed for muscle spasms.   docusate sodium 50 MG capsule Commonly known as: COLACE Take 1 capsule (50 mg total) by mouth 2 (two) times daily.   ferrous sulfate 325 (65 FE) MG tablet Commonly known as: FerrouSul Take 1 tablet (325 mg total) by mouth daily with breakfast.   ibuprofen 600 MG tablet Commonly known as: ADVIL Take 1 tablet (600 mg total) by mouth every 6 (six) hours.   loratadine 10 MG tablet Commonly known as: CLARITIN Take 1 tablet (10 mg total) by mouth daily as needed for allergies.   ondansetron 8 MG disintegrating tablet Commonly known as: ZOFRAN-ODT Take 1 tablet (8 mg total) by mouth every 8 (eight) hours as  needed for nausea.   oxyCODONE-acetaminophen 5-325 MG tablet Commonly known as: PERCOCET/ROXICET Take 2 tablets by mouth every 4 (four) hours as needed (pain scale > 7).   Prenatal 19 29-1 MG Chew Chew 1 tablet by mouth every morning.        Discharge home in stable condition Infant Feeding: Bottle Infant Disposition:home with mother Discharge instruction: per After Visit Summary and Postpartum booklet. Activity: Advance as tolerated. Pelvic rest for 6 weeks.  Diet: routine diet Anticipated Birth Control: Nexplanon Postpartum Appointment:4 weeks Additional Postpartum F/U: none Future Appointments:No future appointments. Follow up Visit:  Follow-up Information    Allyn Kenner, DO. Schedule an appointment as soon as possible for a visit in 4 week(s).   Specialty: Obstetrics and Gynecology Contact information: [address] [place] [address] [phone]                   03/29/2020 Jonelle Sidle, MD

## 2020-03-29 NOTE — Discharge Instructions (Signed)
Prescriptions Motrin 600mg  every 6 hours for pain Acetaminophen 650mg  every 6 hours for moderate pain Percocet (Oxycodone 5mg -acetaminophen 325mg ) every 4 hours for severe pain.  Make sure to not exceed acetaminophen 3000mg  every day.  Postpartum Care After Vaginal Delivery This sheet gives you information about how to care for yourself from the time you deliver your baby to up to 6-12 weeks after delivery (postpartum period). Your health care provider may also give you more specific instructions. If you have problems or questions, contact your health care provider. Follow these instructions at home: Vaginal bleeding  It is normal to have vaginal bleeding (lochia) after delivery. Wear a sanitary pad for vaginal bleeding and discharge. ? During the first week after delivery, the amount and appearance of lochia is often similar to a menstrual period. ? Over the next few weeks, it will gradually decrease to a dry, yellow-brown discharge. ? For most women, lochia stops completely by 4-6 weeks after delivery. Vaginal bleeding can vary from woman to woman.  Change your sanitary pads frequently. Watch for any changes in your flow, such as: ? A sudden increase in volume. ? A change in color. ? Large blood clots.  If you pass a blood clot from your vagina, save it and call your health care provider to discuss. Do not flush blood clots down the toilet before talking with your health care provider.  Do not use tampons or douches until your health care provider says this is safe.  If you are not breastfeeding, your period should return 6-8 weeks after delivery. If you are feeding your child breast milk only (exclusive breastfeeding), your period may not return until you stop breastfeeding. Perineal care  Keep the area between the vagina and the anus (perineum) clean and dry as told by your health care provider. Use medicated pads and pain-relieving sprays and creams as directed.  If you had a cut  in the perineum (episiotomy) or a tear in the vagina, check the area for signs of infection until you are healed. Check for: ? More redness, swelling, or pain. ? Fluid or blood coming from the cut or tear. ? Warmth. ? Pus or a bad smell.  You may be given a squirt bottle to use instead of wiping to clean the perineum area after you go to the bathroom. As you start healing, you may use the squirt bottle before wiping yourself. Make sure to wipe gently.  To relieve pain caused by an episiotomy, a tear in the vagina, or swollen veins in the anus (hemorrhoids), try taking a warm sitz bath 2-3 times a day. A sitz bath is a warm water bath that is taken while you are sitting down. The water should only come up to your hips and should cover your buttocks. Breast care  Within the first few days after delivery, your breasts may feel heavy, full, and uncomfortable (breast engorgement). Milk may also leak from your breasts. Your health care provider can suggest ways to help relieve the discomfort. Breast engorgement should go away within a few days.  If you are breastfeeding: ? Wear a bra that supports your breasts and fits you well. ? Keep your nipples clean and dry. Apply creams and ointments as told by your health care provider. ? You may need to use breast pads to absorb milk that leaks from your breasts. ? You may have uterine contractions every time you breastfeed for up to several weeks after delivery. Uterine contractions help your uterus return to  its normal size. ? If you have any problems with breastfeeding, work with your health care provider or lactation consultant.  If you are not breastfeeding: ? Avoid touching your breasts a lot. Doing this can make your breasts produce more milk. ? Wear a good-fitting bra and use cold packs to help with swelling. ? Do not squeeze out (express) milk. This causes you to make more milk. Intimacy and sexuality  Ask your health care provider when you can  engage in sexual activity. This may depend on: ? Your risk of infection. ? How fast you are healing. ? Your comfort and desire to engage in sexual activity.  You are able to get pregnant after delivery, even if you have not had your period. If desired, talk with your health care provider about methods of birth control (contraception). Medicines  Take over-the-counter and prescription medicines only as told by your health care provider.  If you were prescribed an antibiotic medicine, take it as told by your health care provider. Do not stop taking the antibiotic even if you start to feel better. Activity  Gradually return to your normal activities as told by your health care provider. Ask your health care provider what activities are safe for you.  Rest as much as possible. Try to rest or take a nap while your baby is sleeping. Eating and drinking   Drink enough fluid to keep your urine pale yellow.  Eat high-fiber foods every day. These may help prevent or relieve constipation. High-fiber foods include: ? Whole grain cereals and breads. ? Brown rice. ? Beans. ? Fresh fruits and vegetables.  Do not try to lose weight quickly by cutting back on calories.  Take your prenatal vitamins until your postpartum checkup or until your health care provider tells you it is okay to stop. Lifestyle  Do not use any products that contain nicotine or tobacco, such as cigarettes and e-cigarettes. If you need help quitting, ask your health care provider.  Do not drink alcohol, especially if you are breastfeeding. General instructions  Keep all follow-up visits for you and your baby as told by your health care provider. Most women visit their health care provider for a postpartum checkup within the first 3-6 weeks after delivery. Contact a health care provider if:  You feel unable to cope with the changes that your child brings to your life, and these feelings do not go away.  You feel unusually  sad or worried.  Your breasts become red, painful, or hard.  You have a fever.  You have trouble holding urine or keeping urine from leaking.  You have little or no interest in activities you used to enjoy.  You have not breastfed at all and you have not had a menstrual period for 12 weeks after delivery.  You have stopped breastfeeding and you have not had a menstrual period for 12 weeks after you stopped breastfeeding.  You have questions about caring for yourself or your baby.  You pass a blood clot from your vagina. Get help right away if:  You have chest pain.  You have difficulty breathing.  You have sudden, severe leg pain.  You have severe pain or cramping in your lower abdomen.  You bleed from your vagina so much that you fill more than one sanitary pad in one hour. Bleeding should not be heavier than your heaviest period.  You develop a severe headache.  You faint.  You have blurred vision or spots in your vision.    You have bad-smelling vaginal discharge.  You have thoughts about hurting yourself or your baby. If you ever feel like you may hurt yourself or others, or have thoughts about taking your own life, get help right away. You can go to the nearest emergency department or call:  Your local emergency services (911 in the [place]  A suicide crisis helpline, such as the National Suicide Prevention Lifeline at 1-800-273-8255. This is open 24 hours a day. Summary  The period of time right after you deliver your newborn up to 6-12 weeks after delivery is called the postpartum period.  Gradually return to your normal activities as told by your health care provider.  Keep all follow-up visits for you and your baby as told by your health care provider. This information is not intended to replace advice given to you by your health care provider. Make sure you discuss any questions you have with your health care provider. Document Revised: 08/31/2017 Document  Reviewed: 06/11/2017 Elsevier Patient Education  2020 [employer]  

## 2021-04-15 ENCOUNTER — Other Ambulatory Visit: Payer: Self-pay

## 2021-04-15 ENCOUNTER — Encounter (HOSPITAL_COMMUNITY): Payer: Self-pay | Admitting: *Deleted

## 2021-04-15 ENCOUNTER — Ambulatory Visit (HOSPITAL_COMMUNITY)
Admission: EM | Admit: 2021-04-15 | Discharge: 2021-04-15 | Disposition: A | Discharge status: Home / Self Care | Payer: Medicaid Other | Attending: Medical Oncology | Admitting: Medical Oncology

## 2021-04-15 DIAGNOSIS — L509 Urticaria, unspecified: Secondary | ICD-10-CM

## 2021-04-15 MED ORDER — CETIRIZINE HCL 10 MG PO TABS
10.0000 mg | ORAL_TABLET | Freq: Every day | ORAL | 0 refills | Status: DC
Start: 2021-04-15 — End: 2022-11-11

## 2021-04-15 MED ORDER — FAMOTIDINE 20 MG PO TABS
20.0000 mg | ORAL_TABLET | Freq: Every day | ORAL | Status: DC
  Administered 2021-04-15: 20 mg via ORAL

## 2021-04-15 MED ORDER — FAMOTIDINE 20 MG PO TABS
20.0000 mg | ORAL_TABLET | Freq: Two times a day (BID) | ORAL | 0 refills | Status: DC
Start: 2021-04-15 — End: 2022-11-11

## 2021-04-15 MED ORDER — METHYLPREDNISOLONE SODIUM SUCC 125 MG IJ SOLR
125.0000 mg | Freq: Once | INTRAMUSCULAR | Status: AC
  Administered 2021-04-15: 125 mg via INTRAMUSCULAR

## 2021-04-15 MED ORDER — METHYLPREDNISOLONE SODIUM SUCC 125 MG IJ SOLR
INTRAMUSCULAR | Status: AC
  Filled 2021-04-15: qty 2

## 2021-04-15 MED ORDER — FAMOTIDINE 20 MG PO TABS
ORAL_TABLET | ORAL | Status: AC
  Filled 2021-04-15: qty 1

## 2021-04-15 MED ORDER — PREDNISONE 10 MG (21) PO TBPK
ORAL_TABLET | Freq: Every day | ORAL | 0 refills | Status: DC
Start: 2021-04-15 — End: 2022-01-17

## 2021-04-15 NOTE — ED Triage Notes (Signed)
Pt reports a rash that comes and goes for a couple of days.

## 2021-04-15 NOTE — ED Provider Notes (Signed)
[facility]    CSN: [id number] Arrival date & time: 04/15/21  1323      History   Chief Complaint Chief Complaint  Patient presents with   Rash    HPI Andrea Lynch is a 25 y.o. female.   HPI  Rash: Patient reports that she trialed Azo as a preventative measure a few days ago.  She then developed some itching and urticarial rash of her arms and body.  She states that the rash mainly presents itself after she feels an area that is itchy and then itches her skin.  Other than stopping the Azo she has not tried anything for symptoms.  She denies any fevers, trouble breathing, trouble swallowing.  She denies any current pregnancy and has had recent periods and she is not planning a pregnancy.   Past Medical History:  Diagnosis Date   HSV (herpes simplex virus) infection    Medical history non-contributory     Patient Active Problem List   Diagnosis Date Noted   Normal labor 03/27/2020   Status post normal vaginal delivery 11/20/2014   Non-reactive NST (non-stress test) 11/18/2014    Past Surgical History:  Procedure Laterality Date   NO PAST SURGERIES      OB History     Gravida  2   Para  2   Term  2   Preterm      AB      Living  2      SAB      IAB      Ectopic      Multiple  0   Live Births  2            Home Medications    Prior to Admission medications   Medication Sig Start Date End Date Taking? Authorizing Provider  cetirizine (ZYRTEC ALLERGY) 10 MG tablet Take 1 tablet (10 mg total) by mouth at bedtime. 04/15/21  Yes Juanna Pudlo, Maralyn Sago M, PA-C  famotidine (PEPCID) 20 MG tablet Take 1 tablet (20 mg total) by mouth 2 (two) times daily. 04/15/21  Yes Alacia Rehmann M, PA-C  predniSONE (STERAPRED UNI-PAK 21 TAB) 10 MG (21) TBPK tablet Take by mouth daily. Take 6 tabs by mouth daily  for 2 days, then 5 tabs for 2 days, then 4 tabs for 2 days, then 3 tabs for 2 days, 2 tabs for 2 days, then 1 tab by mouth daily for 2 days 04/15/21  Yes  Jemario Poitras M, PA-C  acetaminophen (TYLENOL) 325 MG tablet Take 2 tablets (650 mg total) by mouth every 4 (four) hours as needed (for pain scale < 4). 03/29/20   Taam-Akelman, Griselda Miner, MD  cyclobenzaprine (FLEXERIL) 10 MG tablet Take 1 tablet (10 mg total) by mouth 2 (two) times daily as needed for muscle spasms. Patient not taking: Reported on 03/28/2020 03/08/20   Sharyon Cable, CNM  docusate sodium (COLACE) 50 MG capsule Take 1 capsule (50 mg total) by mouth 2 (two) times daily. 03/29/20   Taam-Akelman, Griselda Miner, MD  Elastic Bandages & Supports (COMFORT FIT MATERNITY SUPP LG) MISC 1 Units by Does not apply route daily. 03/08/20   Sharyon Cable, CNM  ferrous sulfate (FERROUSUL) 325 (65 FE) MG tablet Take 1 tablet (325 mg total) by mouth daily with breakfast. 03/29/20   Taam-Akelman, Griselda Miner, MD  ibuprofen (ADVIL) 600 MG tablet Take 1 tablet (600 mg total) by mouth every 6 (six) hours. 03/29/20   Rande Brunt, MD  loratadine (CLARITIN) 10 MG tablet Take 1 tablet (10 mg total) by mouth daily as needed for allergies. Patient not taking: Reported on 03/28/2020 02/23/16   Sudie Grumbling, NP  ondansetron (ZOFRAN-ODT) 8 MG disintegrating tablet Take 1 tablet (8 mg total) by mouth every 8 (eight) hours as needed for nausea. Patient not taking: Reported on 03/28/2020 08/10/19   Elvina Sidle, MD  oxyCODONE-acetaminophen (PERCOCET/ROXICET) 5-325 MG tablet Take 2 tablets by mouth every 4 (four) hours as needed (pain scale > 7). 03/29/20   Taam-Akelman, Griselda Miner, MD  Prenatal Vit-Fe Fumarate-FA (PRENATAL 19) 29-1 MG CHEW Chew 1 tablet by mouth every morning. 08/10/19   Elvina Sidle, MD    Family History Family History  Problem Relation Age of Onset   Hypertension Mother    Hypertension Father    Allergies Sister     Social History Social History   Tobacco Use   Smoking status: Never   Smokeless tobacco: Never  Vaping Use   Vaping Use: Never used  Substance Use  Topics   Alcohol use: No   Drug use: No     Allergies   Patient has no known allergies.   Review of Systems Review of Systems  As stated above in HPI Physical Exam Triage Vital Signs ED Triage Vitals  Enc Vitals Group     BP 04/15/21 1411 131/80     Pulse Rate 04/15/21 1411 81     Resp 04/15/21 1411 18     Temp 04/15/21 1411 98.6 F (37 C)     Temp src --      SpO2 04/15/21 1411 99 %     Weight --      Height --      Head Circumference --      Peak Flow --      Pain Score 04/15/21 1409 4     Pain Loc --      Pain Edu? --      Excl. in GC? --    No data found.  Updated Vital Signs BP 131/80   Pulse 81   Temp 98.6 F (37 C)   Resp 18   LMP 03/23/2021 (Approximate)   SpO2 99%   Breastfeeding No   Physical Exam Vitals and nursing note reviewed.  Constitutional:      General: She is not in acute distress.    Appearance: Normal appearance. She is not ill-appearing, toxic-appearing or diaphoretic.  HENT:     Mouth/Throat:     Pharynx: Oropharynx is clear.  Cardiovascular:     Rate and Rhythm: Normal rate and regular rhythm.     Heart sounds: Normal heart sounds.  Pulmonary:     Effort: Pulmonary effort is normal.     Breath sounds: Normal breath sounds.  Skin:    Findings: Rash (scattered small urticars of arms and neck) present.  Neurological:     Mental Status: She is alert.     UC Treatments / Results  Labs (all labs ordered are listed, but only abnormal results are displayed) Labs Reviewed - No data to display  EKG   Radiology No results found.  Procedures Procedures (including critical care time)  Medications Ordered in UC Medications  famotidine (PEPCID) tablet 20 mg (20 mg Oral Given 04/15/21 1503)  methylPREDNISolone sodium succinate (SOLU-MEDROL) 125 mg/2 mL injection 125 mg (125 mg Intramuscular Given 04/15/21 1503)    Initial Impression / Assessment and Plan / UC Course  I have reviewed the triage vital  signs and the nursing  notes.  Pertinent labs & imaging results that were available during my care of the patient were reviewed by me and considered in my medical decision making (see chart for details).     New.  Advised her to continue holding of that Azo- added to allergy list.  Treating with Solu-Medrol, Pepcid and sending her home with Zyrtec, Pepcid and prednisone.  Discussed how to use these medications along with common potential side effects and precautions.  Discussed red flag signs and symptoms.  Follow-up as needed.  Final diagnoses:  Urticaria   Discharge Instructions   None    ED Prescriptions     Medication Sig Dispense Auth. Provider   cetirizine (ZYRTEC ALLERGY) 10 MG tablet Take 1 tablet (10 mg total) by mouth at bedtime. 7 tablet Emmaclaire Switala M, PA-C   famotidine (PEPCID) 20 MG tablet Take 1 tablet (20 mg total) by mouth 2 (two) times daily. 7 tablet Cesare Sumlin M, PA-C   predniSONE (STERAPRED UNI-PAK 21 TAB) 10 MG (21) TBPK tablet Take by mouth daily. Take 6 tabs by mouth daily  for 2 days, then 5 tabs for 2 days, then 4 tabs for 2 days, then 3 tabs for 2 days, 2 tabs for 2 days, then 1 tab by mouth daily for 2 days 42 tablet Keltie Labell, Brand Males, [place]      PDMP not reviewed this encounter.   Rushie Chestnut, [place] 04/15/21 [phone]

## 2021-08-30 ENCOUNTER — Other Ambulatory Visit: Payer: Self-pay

## 2021-08-30 ENCOUNTER — Ambulatory Visit (HOSPITAL_COMMUNITY)
Admission: EM | Admit: 2021-08-30 | Discharge: 2021-08-30 | Disposition: A | Discharge status: Home / Self Care | Payer: Medicaid Other | Attending: Physician Assistant | Admitting: Physician Assistant

## 2021-08-30 ENCOUNTER — Encounter (HOSPITAL_COMMUNITY): Payer: Self-pay | Admitting: Emergency Medicine

## 2021-08-30 DIAGNOSIS — N3000 Acute cystitis without hematuria: Secondary | ICD-10-CM | POA: Diagnosis not present

## 2021-08-30 LAB — POCT URINALYSIS DIPSTICK, ED / UC
Bilirubin Urine: NEGATIVE
Glucose, UA: NEGATIVE mg/dL
Ketones, ur: NEGATIVE mg/dL
Nitrite: POSITIVE — AB
Protein, ur: NEGATIVE mg/dL
Specific Gravity, Urine: 1.025 (ref 1.005–1.030)
Urobilinogen, UA: 0.2 mg/dL (ref 0.0–1.0)
pH: 6.5 (ref 5.0–8.0)

## 2021-08-30 MED ORDER — NITROFURANTOIN MONOHYD MACRO 100 MG PO CAPS
100.0000 mg | ORAL_CAPSULE | Freq: Two times a day (BID) | ORAL | 0 refills | Status: DC
Start: 2021-08-30 — End: 2022-01-17

## 2021-08-30 NOTE — Discharge Instructions (Addendum)
Take antibiotic as prescribed Drink plenty of fluids Return if symptoms become worse or you develop fever, flank pain

## 2021-08-30 NOTE — ED Triage Notes (Signed)
Patient c/o dysuria x 2-3 days.   Patient endorses ABD pain.   Patient endorses cloudy urine and a "egg" odor.   Patient endorses "fishy" vaginal discharge odor.    Patient hasn't used any medications for symptoms.

## 2021-08-30 NOTE — ED Provider Notes (Signed)
[facility]    CSN: [id number] Arrival date & time: 08/30/21  1617      History   Chief Complaint Chief Complaint  Patient presents with   Dysuria    HPI Andrea Lynch is a 25 y.o. female.   Pt reports lower abdominal discomfort and dysuria that started 2-3 days ago.  Reports mild increased vaginal discharge with foul odor.  Denies itching, rash, pelvic pain, flank pain, n/v/d, fever, chills.  She has tried nothing for the sx.    Past Medical History:  Diagnosis Date   HSV (herpes simplex virus) infection    Medical history non-contributory     Patient Active Problem List   Diagnosis Date Noted   Normal labor 03/27/2020   Status post normal vaginal delivery 11/20/2014   Non-reactive NST (non-stress test) 11/18/2014    Past Surgical History:  Procedure Laterality Date   NO PAST SURGERIES      OB History     Gravida  2   Para  2   Term  2   Preterm      AB      Living  2      SAB      IAB      Ectopic      Multiple  0   Live Births  2            Home Medications    Prior to Admission medications   Medication Sig Start Date End Date Taking? Authorizing Provider  nitrofurantoin, macrocrystal-monohydrate, (MACROBID) 100 MG capsule Take 1 capsule (100 mg total) by mouth 2 (two) times daily. 08/30/21  Yes Ward, Tylene Fantasia, PA-C  acetaminophen (TYLENOL) 325 MG tablet Take 2 tablets (650 mg total) by mouth every 4 (four) hours as needed (for pain scale < 4). 03/29/20   Taam-Akelman, Griselda Miner, MD  cetirizine (ZYRTEC ALLERGY) 10 MG tablet Take 1 tablet (10 mg total) by mouth at bedtime. 04/15/21   Rushie Chestnut, PA-C  cyclobenzaprine (FLEXERIL) 10 MG tablet Take 1 tablet (10 mg total) by mouth 2 (two) times daily as needed for muscle spasms. Patient not taking: Reported on 03/28/2020 03/08/20   Sharyon Cable, CNM  docusate sodium (COLACE) 50 MG capsule Take 1 capsule (50 mg total) by mouth 2 (two) times daily. 03/29/20    Taam-Akelman, Griselda Miner, MD  Elastic Bandages & Supports (COMFORT FIT MATERNITY SUPP LG) MISC 1 Units by Does not apply route daily. 03/08/20   Sharyon Cable, CNM  famotidine (PEPCID) 20 MG tablet Take 1 tablet (20 mg total) by mouth 2 (two) times daily. 04/15/21   Rushie Chestnut, PA-C  ferrous sulfate (FERROUSUL) 325 (65 FE) MG tablet Take 1 tablet (325 mg total) by mouth daily with breakfast. 03/29/20   Taam-Akelman, Griselda Miner, MD  ibuprofen (ADVIL) 600 MG tablet Take 1 tablet (600 mg total) by mouth every 6 (six) hours. 03/29/20   Taam-Akelman, Griselda Miner, MD  loratadine (CLARITIN) 10 MG tablet Take 1 tablet (10 mg total) by mouth daily as needed for allergies. Patient not taking: Reported on 03/28/2020 02/23/16   Sudie Grumbling, NP  ondansetron (ZOFRAN-ODT) 8 MG disintegrating tablet Take 1 tablet (8 mg total) by mouth every 8 (eight) hours as needed for nausea. Patient not taking: Reported on 03/28/2020 08/10/19   Elvina Sidle, MD  oxyCODONE-acetaminophen (PERCOCET/ROXICET) 5-325 MG tablet Take 2 tablets by mouth every 4 (four) hours as needed (pain scale > 7). 03/29/20  Taam-Akelman, Griselda Miner, MD  predniSONE (STERAPRED UNI-PAK 21 TAB) 10 MG (21) TBPK tablet Take by mouth daily. Take 6 tabs by mouth daily  for 2 days, then 5 tabs for 2 days, then 4 tabs for 2 days, then 3 tabs for 2 days, 2 tabs for 2 days, then 1 tab by mouth daily for 2 days 04/15/21   Rushie Chestnut, PA-C  Prenatal Vit-Fe Fumarate-FA (PRENATAL 19) 29-1 MG CHEW Chew 1 tablet by mouth every morning. 08/10/19   Elvina Sidle, MD    Family History Family History  Problem Relation Age of Onset   Hypertension Mother    Hypertension Father    Allergies Sister     Social History Social History   Tobacco Use   Smoking status: Never   Smokeless tobacco: Never  Vaping Use   Vaping Use: Never used  Substance Use Topics   Alcohol use: No   Drug use: No     Allergies   Azo urinary tract support   Review  of Systems Review of Systems  Constitutional:  Negative for chills and fever.  HENT:  Negative for ear pain and sore throat.   Eyes:  Negative for pain and visual disturbance.  Respiratory:  Negative for cough and shortness of breath.   Cardiovascular:  Negative for chest pain and palpitations.  Gastrointestinal:  Positive for abdominal pain. Negative for diarrhea, nausea and vomiting.  Genitourinary:  Positive for dysuria and vaginal discharge. Negative for hematuria, pelvic pain, urgency and vaginal pain.  Musculoskeletal:  Negative for arthralgias and back pain.  Skin:  Negative for color change and rash.  Neurological:  Negative for seizures and syncope.  All other systems reviewed and are negative.   Physical Exam Triage Vital Signs ED Triage Vitals  Enc Vitals Group     BP 08/30/21 1703 129/65     Pulse Rate 08/30/21 1703 68     Resp 08/30/21 1703 14     Temp 08/30/21 1703 99.4 F (37.4 C)     Temp Source 08/30/21 1703 Oral     SpO2 08/30/21 1703 100 %     Weight --      Height --      Head Circumference --      Peak Flow --      Pain Score 08/30/21 1706 0     Pain Loc --      Pain Edu? --      Excl. in [place]? --    No data found.  Updated Vital Signs BP 129/65 (BP Location: Right Arm)    Pulse 68    Temp 99.4 F (37.4 C) (Oral)    Resp 14    LMP 08/23/2021 (Exact Date)    SpO2 100%    Breastfeeding No   Visual Acuity Right Eye Distance:   Left Eye Distance:   Bilateral Distance:    Right Eye Near:   Left Eye Near:    Bilateral Near:     Physical Exam Vitals and nursing note reviewed.  Constitutional:      General: She is not in acute distress.    Appearance: She is well-developed.  HENT:     Head: Normocephalic and atraumatic.  Eyes:     Conjunctiva/sclera: Conjunctivae normal.  Cardiovascular:     Rate and Rhythm: Normal rate and regular rhythm.     Heart sounds: No murmur heard. Pulmonary:     Effort: Pulmonary effort is normal. No respiratory  distress.  Breath sounds: Normal breath sounds.  Abdominal:     Palpations: Abdomen is soft.     Tenderness: There is no abdominal tenderness.  Musculoskeletal:        General: No swelling.     Cervical back: Neck supple.  Skin:    General: Skin is warm and dry.     Capillary Refill: Capillary refill takes less than 2 seconds.  Neurological:     Mental Status: She is alert.  Psychiatric:        Mood and Affect: Mood normal.     UC Treatments / Results  Labs (all labs ordered are listed, but only abnormal results are displayed) Labs Reviewed  POCT URINALYSIS DIPSTICK, ED / UC - Abnormal; Notable for the following components:      Result Value   Hgb urine dipstick SMALL (*)    Nitrite POSITIVE (*)    Leukocytes,Ua SMALL (*)    All other components within normal limits  CERVICOVAGINAL ANCILLARY ONLY    EKG   Radiology No results found.  Procedures Procedures (including critical care time)  Medications Ordered in UC Medications - No data to display  Initial Impression / Assessment and Plan / UC Course  I have reviewed the triage vital signs and the nursing notes.  Pertinent labs & imaging results that were available during my care of the patient were reviewed by me and considered in my medical decision making (see chart for details).     UTI, antibiotic prescribed. Cervicovaginal swab pending.  Will treat if indicated based on lab results.  Return precautions discussed.  Final Clinical Impressions(s) / UC Diagnoses   Final diagnoses:  Acute cystitis without hematuria     Discharge Instructions      Take antibiotic as prescribed Drink plenty of fluids Return if symptoms become worse or you develop fever, flank pain    ED Prescriptions     Medication Sig Dispense Auth. Provider   nitrofurantoin, macrocrystal-monohydrate, (MACROBID) 100 MG capsule Take 1 capsule (100 mg total) by mouth 2 (two) times daily. 10 capsule Ward, Lenise Arena, PA-C       PDMP not reviewed this encounter.   Ward, Lenise Arena, PA-C 08/30/21 1736

## 2021-08-31 LAB — CERVICOVAGINAL ANCILLARY ONLY
Bacterial Vaginitis (gardnerella): POSITIVE — AB
Candida Glabrata: NEGATIVE
Candida Vaginitis: NEGATIVE
Chlamydia: NEGATIVE
Comment: NEGATIVE
Comment: NEGATIVE
Comment: NEGATIVE
Comment: NEGATIVE
Comment: NEGATIVE
Comment: NORMAL
Neisseria Gonorrhea: NEGATIVE
Trichomonas: POSITIVE — AB

## 2021-09-01 ENCOUNTER — Telehealth (HOSPITAL_COMMUNITY): Payer: Self-pay | Admitting: Emergency Medicine

## 2021-09-01 MED ORDER — METRONIDAZOLE 500 MG PO TABS
500.0000 mg | ORAL_TABLET | Freq: Two times a day (BID) | ORAL | 0 refills | Status: DC
Start: 2021-09-01 — End: 2022-01-17

## 2021-12-31 ENCOUNTER — Encounter (HOSPITAL_COMMUNITY): Payer: Self-pay | Admitting: Emergency Medicine

## 2021-12-31 ENCOUNTER — Emergency Department (HOSPITAL_COMMUNITY)
Admission: EM | Admit: 2021-12-31 | Discharge: 2021-12-31 | Disposition: A | Discharge status: Home / Self Care | Payer: Medicaid Other | Attending: Emergency Medicine | Admitting: Emergency Medicine

## 2021-12-31 ENCOUNTER — Other Ambulatory Visit: Payer: Self-pay

## 2021-12-31 DIAGNOSIS — H5711 Ocular pain, right eye: Secondary | ICD-10-CM | POA: Diagnosis present

## 2021-12-31 MED ORDER — ERYTHROMYCIN 5 MG/GM OP OINT
1.0000 | TOPICAL_OINTMENT | Freq: Once | OPHTHALMIC | Status: AC
Start: 2021-12-31 — End: 2021-12-31
  Administered 2021-12-31: 1 via OPHTHALMIC
  Filled 2021-12-31: qty 3.5

## 2021-12-31 MED ORDER — FLUORESCEIN SODIUM 1 MG OP STRP
1.0000 | ORAL_STRIP | Freq: Once | OPHTHALMIC | Status: AC
  Administered 2021-12-31: 1 via OPHTHALMIC
  Filled 2021-12-31: qty 1

## 2021-12-31 MED ORDER — TETRACAINE HCL 0.5 % OP SOLN
1.0000 [drp] | Freq: Once | OPHTHALMIC | Status: AC
  Administered 2021-12-31: 1 [drp] via OPHTHALMIC
  Filled 2021-12-31: qty 4

## 2021-12-31 NOTE — ED Triage Notes (Signed)
Pt believes she has a contact stuck in her R eye since this morning. ?

## 2021-12-31 NOTE — Discharge Instructions (Signed)
As discussed, no contact lens was identified on exam today.  There is a small chance that this may be in one of the recesses of your eyelid.  Please follow-up with your, Lawrence [place]se[place] or ophthalmologist.  In the interim, please use the provided erythromycin, 3 times daily.  Return here for concerning changes in your condition. ?

## 2021-12-31 NOTE — ED Provider Notes (Signed)
?[facility] EMERGENCY DEPARTMENT ?Provider Note ? ? ?CSN: [id number] ?Arrival date & time: 12/31/21  1545 ? ?  ? ?History ? ?No chief complaint on file. ? ? ?Andrea Lynch is a 26 y.o. female. ? ?  ? ?Patient presents with concern of right eye discomfort, concern for possible retained contact lens. ?She is generally well, denies medical problems, wears contacts.  She notes that she slept in them overnight, took them out this morning, tried to put them in again and since that time has had sensation that 1 is retained in the right eye, but not in the appropriate location.  There is associated lack of improvement in her vision that she associates with wearing her contacts appropriately.  No other complaints, injuries, concerns ?  ? ?Home Medications ?Prior to Admission medications   ?Medication Sig Start Date End Date Taking? Authorizing Provider  ?acetaminophen (TYLENOL) 325 MG tablet Take 2 tablets (650 mg total) by mouth every 4 (four) hours as needed (for pain scale < 4). 03/29/20   Taam-Akelman, Griselda Miner, MD  ?cetirizine (ZYRTEC ALLERGY) 10 MG tablet Take 1 tablet (10 mg total) by mouth at bedtime. 04/15/21   Rushie Chestnut, PA-C  ?cyclobenzaprine (FLEXERIL) 10 MG tablet Take 1 tablet (10 mg total) by mouth 2 (two) times daily as needed for muscle spasms. ?Patient not taking: Reported on 03/28/2020 03/08/20   Sharyon Cable, CNM  ?docusate sodium (COLACE) 50 MG capsule Take 1 capsule (50 mg total) by mouth 2 (two) times daily. 03/29/20   Rande Brunt, MD  ?Elastic Bandages & Supports (COMFORT FIT MATERNITY SUPP LG) MISC 1 Units by Does not apply route daily. 03/08/20   Sharyon Cable, CNM  ?famotidine (PEPCID) 20 MG tablet Take 1 tablet (20 mg total) by mouth 2 (two) times daily. 04/15/21   Rushie Chestnut, PA-C  ?ferrous sulfate (FERROUSUL) 325 (65 FE) MG tablet Take 1 tablet (325 mg total) by mouth daily with breakfast. 03/29/20   Taam-Akelman, Griselda Miner, MD  ?ibuprofen (ADVIL)  600 MG tablet Take 1 tablet (600 mg total) by mouth every 6 (six) hours. 03/29/20   Rande Brunt, MD  ?loratadine (CLARITIN) 10 MG tablet Take 1 tablet (10 mg total) by mouth daily as needed for allergies. ?Patient not taking: Reported on 03/28/2020 02/23/16   Sudie Grumbling, NP  ?metroNIDAZOLE (FLAGYL) 500 MG tablet Take 1 tablet (500 mg total) by mouth 2 (two) times daily. 09/01/21   Merrilee Jansky, MD  ?nitrofurantoin, macrocrystal-monohydrate, (MACROBID) 100 MG capsule Take 1 capsule (100 mg total) by mouth 2 (two) times daily. 08/30/21   Ward, Tylene Fantasia, PA-C  ?ondansetron (ZOFRAN-ODT) 8 MG disintegrating tablet Take 1 tablet (8 mg total) by mouth every 8 (eight) hours as needed for nausea. ?Patient not taking: Reported on 03/28/2020 08/10/19   Elvina Sidle, MD  ?oxyCODONE-acetaminophen (PERCOCET/ROXICET) 5-325 MG tablet Take 2 tablets by mouth every 4 (four) hours as needed (pain scale > 7). 03/29/20   Taam-Akelman, Griselda Miner, MD  ?predniSONE (STERAPRED UNI-PAK 21 TAB) 10 MG (21) TBPK tablet Take by mouth daily. Take 6 tabs by mouth daily  for 2 days, then 5 tabs for 2 days, then 4 tabs for 2 days, then 3 tabs for 2 days, 2 tabs for 2 days, then 1 tab by mouth daily for 2 days 04/15/21   Rushie Chestnut, PA-C  ?Prenatal Vit-Fe Fumarate-FA (PRENATAL 19) 29-1 MG CHEW Chew 1 tablet by mouth every morning. 08/10/19  Elvina Sidle, MD  ?   ? ?Allergies    ?Azo urinary tract support   ? ?Review of Systems   ?Review of Systems  ?All other systems reviewed and are negative. ? ?Physical Exam ?Updated Vital Signs ?BP 118/78   Pulse (!) 52   Temp 98 ?F (36.7 ?C) (Oral)   Resp 18   LMP 11/25/2021   SpO2 100%  ?Physical Exam ?Vitals and nursing note reviewed.  ?Constitutional:   ?   General: She is not in acute distress. ?   Appearance: She is well-developed.  ?HENT:  ?   Head: Normocephalic and atraumatic.  ?Eyes:  ?   General: Lids are everted, no foreign bodies appreciated. Vision grossly  intact. Gaze aligned appropriately.     ?   Right eye: No foreign body or discharge.  ?   Extraocular Movements:  ?   Right eye: Normal extraocular motion and no nystagmus.  ?   Conjunctiva/sclera: Conjunctivae normal.  ? ?Pulmonary:  ?   Effort: Pulmonary effort is normal. No respiratory distress.  ?Abdominal:  ?   General: There is no distension.  ?Skin: ?   General: Skin is warm and dry.  ?Neurological:  ?   Mental Status: She is alert and oriented to person, place, and time.  ?   Cranial Nerves: No cranial nerve deficit.  ?Psychiatric:     ?   Mood and Affect: Mood normal.  ? ? ?ED Results / Procedures / Treatments   ?Labs ?(all labs ordered are listed, but only abnormal results are displayed) ?Labs Reviewed - No data to display ? ?EKG ?None ? ?Radiology ?No results found. ? ?Procedures ?Procedures  ? ? ?Medications Ordered in ED ?Medications  ?tetracaine (PONTOCAINE) 0.5 % ophthalmic solution 1 drop (1 drop Left Eye Given 12/31/21 1721)  ?fluorescein ophthalmic strip 1 strip (1 strip Left Eye Given 12/31/21 1721)  ?erythromycin ophthalmic ointment 1 application. (1 application. Right Eye Given 12/31/21 1818)  ? ? ?ED Course/ Medical Decision Making/ A&P ?Adult female presents with right eye pain.  She is awake, alert, hemodynamic unremarkable, has for her normal vision.  After tetracaine was applied, no contact lens was identified.  Some suspicion for mild corneal abrasion versus difficult to identify contact lens.  Patient received his erythromycin had resolution of discomfort, we lengthy conversation about possibilities including as above, retained contact lens versus corneal abrasion, she may recognize the importance of following up with ophthalmology. ?Final Clinical Impression(s) / ED Diagnoses ?Final diagnoses:  ?Eye pain, right  ? ? ?Rx / DC Orders ?ED Discharge Orders   ? ? None  ? ?  ? ? ?  ?Gerhard Munch, MD ?12/31/21 1855 ? ?

## 2022-01-17 ENCOUNTER — Ambulatory Visit (HOSPITAL_COMMUNITY)
Admission: EM | Admit: 2022-01-17 | Discharge: 2022-01-17 | Disposition: A | Discharge status: Home / Self Care | Payer: Medicaid Other | Attending: Internal Medicine | Admitting: Internal Medicine

## 2022-01-17 ENCOUNTER — Encounter (HOSPITAL_COMMUNITY): Payer: Self-pay

## 2022-01-17 DIAGNOSIS — N898 Other specified noninflammatory disorders of vagina: Secondary | ICD-10-CM | POA: Diagnosis present

## 2022-01-17 DIAGNOSIS — Z202 Contact with and (suspected) exposure to infections with a predominantly sexual mode of transmission: Secondary | ICD-10-CM

## 2022-01-17 LAB — POCT URINALYSIS DIPSTICK, ED / UC
Bilirubin Urine: NEGATIVE
Glucose, UA: NEGATIVE mg/dL
Hgb urine dipstick: NEGATIVE
Ketones, ur: NEGATIVE mg/dL
Leukocytes,Ua: NEGATIVE
Nitrite: NEGATIVE
Protein, ur: NEGATIVE mg/dL
Specific Gravity, Urine: 1.025 (ref 1.005–1.030)
Urobilinogen, UA: 1 mg/dL (ref 0.0–1.0)
pH: 7 (ref 5.0–8.0)

## 2022-01-17 MED ORDER — METRONIDAZOLE 500 MG PO TABS: 500.0000 mg | ORAL_TABLET | Freq: Two times a day (BID) | ORAL | 0 refills | Status: DC

## 2022-01-17 NOTE — Discharge Instructions (Signed)
Your urine was negative for infection today. Plan to treat your BV today with Flagyl prophylactically.  Do not drink alcohol 24 to 48 hours after taking this medication, it will make you very sick if you do.  Your STI labs are pending, but you will receive these results via MyChart or phone call if any of your results are abnormal.  If your results do not show bacterial vaginosis, we will have you discontinue the Flagyl. We will treat for any other vaginal infections found on the STI swab once it results.  ? ?If you develop any new or worsening symptoms or do not improve in the next 2 to 3 days, please return.  If your symptoms are severe, please go to the emergency room.  Follow-up with your primary care provider for further evaluation and management of your symptoms as well as ongoing wellness visits.  I hope you feel better! ?

## 2022-01-17 NOTE — ED Provider Notes (Signed)
?[facility] ? ? ? ?CSN: [id number] ?Arrival date & time: 01/17/22  1540 ? ? ?  ? ?History   ?Chief Complaint ?Chief Complaint  ?Patient presents with  ? Abdominal Pain  ? ? ?HPI ?Andrea Lynch is a 26 y.o. female.  ? ?Patient presents urgent care today with vaginal discharge that is whitish clear and has a fishy odor for the last 2 days.  She has a history of bacterial vaginosis and states the symptoms feel the same as when she had BV last time.  She also reports urinary frequency and urgency for the last 2 days associated with right lower quadrant abdominal pain.  Abdominal pain is mild at this time.  She has not taken any medication for her abdominal pain.  She is requesting STI testing today.  She denies any new sexual partners recently.  Denies dysuria, nausea, fever, vomiting, diarrhea, constipation, dizziness, and back pain.  Denies any other aggravating or relieving factors to symptoms. ? ? ?Abdominal Pain ? ?Past Medical History:  ?Diagnosis Date  ? HSV (herpes simplex virus) infection   ? Medical history non-contributory   ? ? ?Patient Active Problem List  ? Diagnosis Date Noted  ? Normal labor 03/27/2020  ? Status post normal vaginal delivery 11/20/2014  ? Non-reactive NST (non-stress test) 11/18/2014  ? ? ?Past Surgical History:  ?Procedure Laterality Date  ? NO PAST SURGERIES    ? ? ?OB History   ? ? Gravida  ?2  ? Para  ?2  ? Term  ?2  ? Preterm  ?   ? AB  ?   ? Living  ?2  ?  ? ? SAB  ?   ? IAB  ?   ? Ectopic  ?   ? Multiple  ?0  ? Live Births  ?2  ?   ?  ?  ? ? ? ?Home Medications   ? ?Prior to Admission medications   ?Medication Sig Start Date End Date Taking? Authorizing Provider  ?acetaminophen (TYLENOL) 325 MG tablet Take 2 tablets (650 mg total) by mouth every 4 (four) hours as needed (for pain scale < 4). 03/29/20   Taam-Akelman, Lawrence Santiago, MD  ?cetirizine (ZYRTEC ALLERGY) 10 MG tablet Take 1 tablet (10 mg total) by mouth at bedtime. 04/15/21   Hughie Closs, PA-C  ?cyclobenzaprine  (FLEXERIL) 10 MG tablet Take 1 tablet (10 mg total) by mouth 2 (two) times daily as needed for muscle spasms. ?Patient not taking: Reported on 03/28/2020 03/08/20   Lajean Manes, CNM  ?docusate sodium (COLACE) 50 MG capsule Take 1 capsule (50 mg total) by mouth 2 (two) times daily. 03/29/20   Jonelle Sidle, MD  ?Elastic Bandages & Supports (COMFORT FIT MATERNITY SUPP LG) MISC 1 Units by Does not apply route daily. 03/08/20   Lajean Manes, CNM  ?famotidine (PEPCID) 20 MG tablet Take 1 tablet (20 mg total) by mouth 2 (two) times daily. 04/15/21   Hughie Closs, PA-C  ?ferrous sulfate (FERROUSUL) 325 (65 FE) MG tablet Take 1 tablet (325 mg total) by mouth daily with breakfast. 03/29/20   Taam-Akelman, Lawrence Santiago, MD  ?ibuprofen (ADVIL) 600 MG tablet Take 1 tablet (600 mg total) by mouth every 6 (six) hours. 03/29/20   Jonelle Sidle, MD  ?loratadine (CLARITIN) 10 MG tablet Take 1 tablet (10 mg total) by mouth daily as needed for allergies. ?Patient not taking: Reported on 03/28/2020 02/23/16   Katy Apo, NP  ?ondansetron (  ZOFRAN-ODT) 8 MG disintegrating tablet Take 1 tablet (8 mg total) by mouth every 8 (eight) hours as needed for nausea. ?Patient not taking: Reported on 03/28/2020 08/10/19   Robyn Haber, MD  ?oxyCODONE-acetaminophen (PERCOCET/ROXICET) 5-325 MG tablet Take 2 tablets by mouth every 4 (four) hours as needed (pain scale > 7). 03/29/20   Taam-Akelman, Lawrence Santiago, MD  ?Prenatal Vit-Fe Fumarate-FA (PRENATAL 19) 29-1 MG CHEW Chew 1 tablet by mouth every morning. 08/10/19   Robyn Haber, MD  ? ? ?Family History ?Family History  ?Problem Relation Age of Onset  ? Hypertension Mother   ? Hypertension Father   ? Allergies Sister   ? ? ?Social History ?Social History  ? ?Tobacco Use  ? Smoking status: Every Day  ?  Types: Cigars  ? Smokeless tobacco: Never  ?Vaping Use  ? Vaping Use: Never used  ?Substance Use Topics  ? Alcohol use: No  ? Drug use: No  ? ? ? ?Allergies   ?Azo  urinary tract support ? ? ?Review of Systems ?Review of Systems  ?Gastrointestinal:  Positive for abdominal pain.  ?Per HPI ? ?Physical Exam ?Triage Vital Signs ?ED Triage Vitals  ?Enc Vitals Group  ?   BP 01/17/22 1640 110/64  ?   Pulse Rate 01/17/22 1640 80  ?   Resp 01/17/22 1640 18  ?   Temp 01/17/22 1640 98 ?F (36.7 ?C)  ?   Temp Source 01/17/22 1640 Oral  ?   SpO2 01/17/22 1640 96 %  ?   Weight --   ?   Height --   ?   Head Circumference --   ?   Peak Flow --   ?   Pain Score 01/17/22 1639 5  ?   Pain Loc --   ?   Pain Edu? --   ?   Excl. in [place]? --   ? ?No data found. ? ?Updated Vital Signs ?BP 110/64 (BP Location: Left Arm)   Pulse 80   Temp 98 ?F (36.7 ?C) (Oral)   Resp 18   LMP 12/28/2021   SpO2 96%  ? ?Visual Acuity ?Right Eye Distance:   ?Left Eye Distance:   ?Bilateral Distance:   ? ?Right Eye Near:   ?Left Eye Near:    ?Bilateral Near:    ? ?Physical Exam ?Vitals and nursing note reviewed.  ?Constitutional:   ?   General: She is not in acute distress. ?   Appearance: Normal appearance. She is well-developed. She is not ill-appearing.  ?HENT:  ?   Head: Normocephalic and atraumatic.  ?   Right Ear: Tympanic membrane, ear canal and external ear normal.  ?   Left Ear: Tympanic membrane, ear canal and external ear normal.  ?   Nose: Nose normal.  ?   Mouth/Throat:  ?   Mouth: Mucous membranes are moist.  ?Eyes:  ?   Extraocular Movements: Extraocular movements intact.  ?   Conjunctiva/sclera: Conjunctivae normal.  ?   Pupils: Pupils are equal, round, and reactive to light.  ?Cardiovascular:  ?   Rate and Rhythm: Normal rate and regular rhythm.  ?   Heart sounds: Normal heart sounds. No murmur heard. ?  No friction rub. No gallop.  ?Pulmonary:  ?   Effort: Pulmonary effort is normal. No respiratory distress.  ?   Breath sounds: Normal breath sounds. No wheezing, rhonchi or rales.  ?Chest:  ?   Chest wall: No tenderness.  ?Abdominal:  ?   General: Abdomen  is protuberant. Bowel sounds are normal.  ?    Palpations: Abdomen is soft.  ?   Tenderness: There is no abdominal tenderness. There is no right CVA tenderness or left CVA tenderness.  ?   Comments: Patient is nontender to abdominal exam with light and deep palpation.  Bowel sounds auscultated in all 4 quadrants.  Benign abdominal exam.  ?Musculoskeletal:     ?   General: No swelling.  ?   Cervical back: Neck supple.  ?Skin: ?   General: Skin is warm and dry.  ?   Capillary Refill: Capillary refill takes less than 2 seconds.  ?   Findings: No rash.  ?Neurological:  ?   General: No focal deficit present.  ?   Mental Status: She is alert and oriented to person, place, and time.  ?Psychiatric:     ?   Mood and Affect: Mood normal.     ?   Behavior: Behavior normal.     ?   Thought Content: Thought content normal.     ?   Judgment: Judgment normal.  ? ? ? ?UC Treatments / Results  ?Labs ?(all labs ordered are listed, but only abnormal results are displayed) ?Labs Reviewed  ?POCT URINALYSIS DIPSTICK, ED / UC  ?CERVICOVAGINAL ANCILLARY ONLY  ? ? ?EKG ? ? ?Radiology ?No results found. ? ?Procedures ?Procedures (including critical care time) ? ?Medications Ordered in UC ?Medications - No data to display ? ?Initial Impression / Assessment and Plan / UC Course  ?I have reviewed the triage vital signs and the nursing notes. ? ?Pertinent labs & imaging results that were available during my care of the patient were reviewed by me and considered in my medical decision making (see chart for details). ? ?Patient is a 26 year old female presenting to urgent care today with urinary symptoms and vaginal discharge/odor.  STI testing sent for evaluation and will result in the next 2 to 3 days.  Plan to treat for bacterial vaginosis with Flagyl prophylactically due to patient's history of bacterial vaginosis and symptoms. She was last treated for BV with Flagyl in December 2022. She was also treated for a UTI at that time with Macrobid. Patient instructed to refrain from drinking  alcohol for 24-48 hours after taking Flagyl. Urinalysis today was negative for urinary tract infection. No clinical indication for urine culture.  ? ?STI labs pending. Will treat for any other infections detected p

## 2022-01-17 NOTE — ED Triage Notes (Signed)
Pt presents with lower abdominal cramping for past few days. ?

## 2022-01-18 ENCOUNTER — Telehealth (HOSPITAL_COMMUNITY): Payer: Self-pay | Admitting: Emergency Medicine

## 2022-01-18 LAB — CERVICOVAGINAL ANCILLARY ONLY
Bacterial Vaginitis (gardnerella): POSITIVE — AB
Candida Glabrata: NEGATIVE
Candida Vaginitis: NEGATIVE
Chlamydia: NEGATIVE
Comment: NEGATIVE
Comment: NEGATIVE
Comment: NEGATIVE
Comment: NEGATIVE
Comment: NEGATIVE
Comment: NORMAL
Neisseria Gonorrhea: NEGATIVE
Trichomonas: NEGATIVE

## 2022-01-18 MED ORDER — METRONIDAZOLE 500 MG PO TABS: 500.0000 mg | ORAL_TABLET | Freq: Two times a day (BID) | ORAL | 0 refills | Status: DC

## 2022-03-16 ENCOUNTER — Ambulatory Visit (HOSPITAL_COMMUNITY): Payer: Medicaid Other

## 2022-03-17 ENCOUNTER — Ambulatory Visit (HOSPITAL_COMMUNITY): Payer: Self-pay

## 2022-04-05 ENCOUNTER — Encounter (HOSPITAL_COMMUNITY): Payer: Self-pay

## 2022-04-05 ENCOUNTER — Ambulatory Visit (HOSPITAL_COMMUNITY)
Admission: RE | Admit: 2022-04-05 | Discharge: 2022-04-05 | Disposition: A | Discharge status: Home / Self Care | Payer: Medicaid Other | Source: Ambulatory Visit | Attending: Emergency Medicine | Admitting: Emergency Medicine

## 2022-04-05 VITALS — BP 106/65 | HR 88 | Temp 98.3°F | Resp 18

## 2022-04-05 DIAGNOSIS — N898 Other specified noninflammatory disorders of vagina: Secondary | ICD-10-CM

## 2022-04-05 LAB — POCT URINALYSIS DIPSTICK, ED / UC
Bilirubin Urine: NEGATIVE
Glucose, UA: NEGATIVE mg/dL
Hgb urine dipstick: NEGATIVE
Ketones, ur: NEGATIVE mg/dL
Nitrite: NEGATIVE
Protein, ur: NEGATIVE mg/dL
Specific Gravity, Urine: 1.02 (ref 1.005–1.030)
Urobilinogen, UA: 0.2 mg/dL (ref 0.0–1.0)
pH: 5.5 (ref 5.0–8.0)

## 2022-04-05 MED ORDER — METRONIDAZOLE 500 MG PO TABS: 500.0000 mg | ORAL_TABLET | Freq: Two times a day (BID) | ORAL | 0 refills | Status: DC

## 2022-04-05 NOTE — ED Triage Notes (Signed)
Pt reports abdominal cramping for several days Last menstrual cycle - 03/27/2022 .

## 2022-04-05 NOTE — ED Provider Notes (Signed)
[facility]    CSN: [id number] Arrival date & time: 04/05/22  7829      History   Chief Complaint Chief Complaint  Patient presents with   Abdominal Cramping    HPI Andrea Lynch is a 26 y.o. female.   Patient presents with brown discharge and right lower abdominal cramping occurring intermittently for 5 days.  Has not attempted treatment of symptoms.  Sexually active, no known exposures.  Last menstrual period from 03/27/2022 until 03/30/2022.  Denies vaginal itching, irritation or odor, urinary symptoms, new rash or lesions.  Has history of reoccurring BV  Past Medical History:  Diagnosis Date   HSV (herpes simplex virus) infection    Medical history non-contributory     Patient Active Problem List   Diagnosis Date Noted   Normal labor 03/27/2020   Status post normal vaginal delivery 11/20/2014   Non-reactive NST (non-stress test) 11/18/2014    Past Surgical History:  Procedure Laterality Date   NO PAST SURGERIES      OB History     Gravida  2   Para  2   Term  2   Preterm      AB      Living  2      SAB      IAB      Ectopic      Multiple  0   Live Births  2            Home Medications    Prior to Admission medications   Medication Sig Start Date End Date Taking? Authorizing Provider  acetaminophen (TYLENOL) 325 MG tablet Take 2 tablets (650 mg total) by mouth every 4 (four) hours as needed (for pain scale < 4). 03/29/20   Taam-Akelman, Griselda Miner, MD  cetirizine (ZYRTEC ALLERGY) 10 MG tablet Take 1 tablet (10 mg total) by mouth at bedtime. 04/15/21   Rushie Chestnut, PA-C  cyclobenzaprine (FLEXERIL) 10 MG tablet Take 1 tablet (10 mg total) by mouth 2 (two) times daily as needed for muscle spasms. Patient not taking: Reported on 03/28/2020 03/08/20   Sharyon Cable, CNM  docusate sodium (COLACE) 50 MG capsule Take 1 capsule (50 mg total) by mouth 2 (two) times daily. 03/29/20   Taam-Akelman, Griselda Miner, MD  Elastic Bandages &  Supports (COMFORT FIT MATERNITY SUPP LG) MISC 1 Units by Does not apply route daily. 03/08/20   Sharyon Cable, CNM  famotidine (PEPCID) 20 MG tablet Take 1 tablet (20 mg total) by mouth 2 (two) times daily. 04/15/21   Rushie Chestnut, PA-C  ferrous sulfate (FERROUSUL) 325 (65 FE) MG tablet Take 1 tablet (325 mg total) by mouth daily with breakfast. 03/29/20   Taam-Akelman, Griselda Miner, MD  ibuprofen (ADVIL) 600 MG tablet Take 1 tablet (600 mg total) by mouth every 6 (six) hours. 03/29/20   Taam-Akelman, Griselda Miner, MD  loratadine (CLARITIN) 10 MG tablet Take 1 tablet (10 mg total) by mouth daily as needed for allergies. Patient not taking: Reported on 03/28/2020 02/23/16   Sudie Grumbling, NP  metroNIDAZOLE (FLAGYL) 500 MG tablet Take 1 tablet (500 mg total) by mouth 2 (two) times daily. 01/18/22   Lamptey, Britta Mccreedy, MD  ondansetron (ZOFRAN-ODT) 8 MG disintegrating tablet Take 1 tablet (8 mg total) by mouth every 8 (eight) hours as needed for nausea. Patient not taking: Reported on 03/28/2020 08/10/19   Elvina Sidle, MD  oxyCODONE-acetaminophen (PERCOCET/ROXICET) 5-325 MG tablet Take 2 tablets by mouth  every 4 (four) hours as needed (pain scale > 7). 03/29/20   Taam-Akelman, Griselda Miner, MD  Prenatal Vit-Fe Fumarate-FA (PRENATAL 19) 29-1 MG CHEW Chew 1 tablet by mouth every morning. 08/10/19   Elvina Sidle, MD    Family History Family History  Problem Relation Age of Onset   Hypertension Mother    Hypertension Father    Allergies Sister     Social History Social History   Tobacco Use   Smoking status: Every Day    Types: Cigars   Smokeless tobacco: Never  Vaping Use   Vaping Use: Never used  Substance Use Topics   Alcohol use: No   Drug use: No     Allergies   Azo urinary tract support   Review of Systems Review of Systems  Constitutional: Negative.   Genitourinary:  Positive for pelvic pain and vaginal discharge. Negative for decreased urine volume, difficulty urinating,  dyspareunia, dysuria, enuresis, flank pain, frequency, genital sores, hematuria, menstrual problem, urgency, vaginal bleeding and vaginal pain.  Skin: Negative.      Physical Exam Triage Vital Signs ED Triage Vitals [04/05/22 0952]  Enc Vitals Group     BP 106/65     Pulse Rate 88     Resp 18     Temp 98.3 F (36.8 C)     Temp Source Oral     SpO2 99 %     Weight      Height      Head Circumference      Peak Flow      Pain Score      Pain Loc      Pain Edu?      Excl. in GC?    No data found.  Updated Vital Signs BP 106/65 (BP Location: Left Arm)   Pulse 88   Temp 98.3 F (36.8 C) (Oral)   Resp 18   LMP 03/27/2022 (Exact Date)   SpO2 99%   Visual Acuity Right Eye Distance:   Left Eye Distance:   Bilateral Distance:    Right Eye Near:   Left Eye Near:    Bilateral Near:     Physical Exam Constitutional:      Appearance: Normal appearance.  HENT:     Head: Normocephalic.  Eyes:     Extraocular Movements: Extraocular movements intact.  Pulmonary:     Effort: Pulmonary effort is normal.  Abdominal:     General: Abdomen is flat. Bowel sounds are normal. There is no distension.     Palpations: Abdomen is soft.     Tenderness: There is no abdominal tenderness. There is no right CVA tenderness, left CVA tenderness or guarding.  Skin:    General: Skin is warm and dry.  Neurological:     Mental Status: She is alert and oriented to person, place, and time. Mental status is at baseline.  Psychiatric:        Mood and Affect: Mood normal.        Behavior: Behavior normal.      UC Treatments / Results  Labs (all labs ordered are listed, but only abnormal results are displayed) Labs Reviewed - No data to display  EKG   Radiology No results found.  Procedures Procedures (including critical care time)  Medications Ordered in UC Medications - No data to display  Initial Impression / Assessment and Plan / UC Course  I have reviewed the triage vital  signs and the nursing notes.  Pertinent labs & imaging  results that were available during my care of the patient were reviewed by me and considered in my medical decision making (see chart for details).  Vaginal discharge  We will prophylactically treat for BV due to recurrence, metronidazole 7-day course prescribed, advised abstaining from alcohol during use of medication, STI labs are pending and urinalysis is negative, will treat further per protocol, advised abstinence until lab results, treatment is complete and all symptoms have resolved, may follow-up with urgent care as needed if symptoms persist or worsen Final Clinical Impressions(s) / UC Diagnoses   Final diagnoses:  None   Discharge Instructions   None    ED Prescriptions   None    PDMP not reviewed this encounter.   Valinda Hoar, NP 04/05/22 1022

## 2022-04-05 NOTE — Discharge Instructions (Addendum)
Today you are being treated prophylactically for  Bacterial vaginosis   Urinalysis negative  Take Metronidazole 500 mg twice a day for 7 days, do not drink alcohol while using medication, this will make you feel sick   Bacterial vaginosis which results from an overgrowth of one on several organisms that are normally present in your vagina. Vaginosis is an inflammation of the vagina that can result in discharge, itching and pain.  Labs pending 2-3 days, you will be contacted if positive for any sti and treatment will be sent to the pharmacy, you will have to return to the clinic if positive for gonorrhea to receive treatment   Please refrain from having sex until labs results, if positive please refrain from having sex until treatment complete and symptoms resolve   If positive for  Chlamydia  gonorrhea or trichomoniasis please notify partner or partners so they may tested as well  Moving forward, it is recommended you use some form of protection against the transmission of sti infections  such as condoms or dental dams with each sexual encounter     In addition: Avoid baths, hot tubs and whirlpool spas.  Don't use scented or harsh soaps Avoid irritants. These include scented tampons and pads. Wipe from front to back after using the toilet. Don't douche. Your vagina doesn't require cleansing other than normal bathing.  Use a condom.  Wear cotton underwear, this fabric absorbs some moisture.

## 2022-04-06 ENCOUNTER — Telehealth (HOSPITAL_COMMUNITY): Payer: Self-pay | Admitting: Emergency Medicine

## 2022-04-06 LAB — CERVICOVAGINAL ANCILLARY ONLY
Bacterial Vaginitis (gardnerella): POSITIVE — AB
Candida Glabrata: NEGATIVE
Candida Vaginitis: NEGATIVE
Chlamydia: NEGATIVE
Comment: NEGATIVE
Comment: NEGATIVE
Comment: NEGATIVE
Comment: NEGATIVE
Comment: NEGATIVE
Comment: NORMAL
Neisseria Gonorrhea: NEGATIVE
Trichomonas: NEGATIVE

## 2022-04-06 NOTE — Telephone Encounter (Signed)
OPened in error.

## 2022-04-17 ENCOUNTER — Telehealth (HOSPITAL_COMMUNITY): Payer: Self-pay | Admitting: Emergency Medicine

## 2022-04-17 MED ORDER — FLUCONAZOLE 150 MG PO TABS: 150.0000 mg | ORAL_TABLET | Freq: Once | ORAL | 0 refills | Status: AC

## 2022-04-17 NOTE — Telephone Encounter (Signed)
Patient called requesting antibiotic associated yeast medication after taking Metronidazole from [place] recently.   Reviewed with patient, verified pharmacy, prescription sent

## 2022-04-29 ENCOUNTER — Ambulatory Visit (HOSPITAL_COMMUNITY): Payer: Medicaid Other

## 2022-05-03 ENCOUNTER — Encounter (HOSPITAL_COMMUNITY): Payer: Self-pay

## 2022-05-03 ENCOUNTER — Ambulatory Visit (HOSPITAL_COMMUNITY)
Admission: RE | Admit: 2022-05-03 | Discharge: 2022-05-03 | Disposition: A | Discharge status: Home / Self Care | Payer: Medicaid Other | Source: Ambulatory Visit | Attending: Physician Assistant | Admitting: Physician Assistant

## 2022-05-03 VITALS — BP 120/68 | HR 75 | Temp 98.5°F | Resp 16 | Ht 62.0 in | Wt 200.0 lb

## 2022-05-03 DIAGNOSIS — R1031 Right lower quadrant pain: Secondary | ICD-10-CM | POA: Diagnosis not present

## 2022-05-03 DIAGNOSIS — N898 Other specified noninflammatory disorders of vagina: Secondary | ICD-10-CM | POA: Diagnosis not present

## 2022-05-03 NOTE — Discharge Instructions (Addendum)
Labs will be completed in 48 hours.  If you do not get a call from this office that indicates the labs are negative or normal.  You can look at my chart and see labs posted at 24 to 48 hours. Advised to follow-up PCP or return to urgent care if symptoms fail to improve.

## 2022-05-03 NOTE — ED Triage Notes (Signed)
Patient having abdominal cramps for 2 days. Lower abdominal pain with no radiation. Pain with movement/ bending over.    Patient is due for her period. No bleeding.  No dietary changes or medication changes. No urinary symptoms, nausea, or constipation.

## 2022-05-03 NOTE — ED Provider Notes (Signed)
[facility]    CSN: [id number] Arrival date & time: 05/03/22  1446      History   Chief Complaint Chief Complaint  Patient presents with   Abdominal Pain    HPI Hoorain Crowell is a 26 y.o. female.   26 year old female presents with vaginal discharge and abdominal pain.  Patient indicates that her last menses was August 17 18th and it was normal for her.  She relates that she has been having 2 days of mild right lower quadrant abdominal pain that is dull and intermittent.  She relates this is controlled with OTC medicine.  Patient also relates that for the past 2 days she has been having some vaginal discharge, creamy, thin with odor associated.  Patient does not report vaginal itching.  She denies fever, dysuria, or frequency.  Patient relates she does not have any lower back pain.  She desires to have STI screening but declines HIV and RPR testing at this time.   Abdominal Pain   Past Medical History:  Diagnosis Date   HSV (herpes simplex virus) infection    Medical history non-contributory     Patient Active Problem List   Diagnosis Date Noted   Normal labor 03/27/2020   Status post normal vaginal delivery 11/20/2014   Non-reactive NST (non-stress test) 11/18/2014    Past Surgical History:  Procedure Laterality Date   NO PAST SURGERIES      OB History     Gravida  2   Para  2   Term  2   Preterm      AB      Living  2      SAB      IAB      Ectopic      Multiple  0   Live Births  2            Home Medications    Prior to Admission medications   Medication Sig Start Date End Date Taking? Authorizing Provider  acetaminophen (TYLENOL) 325 MG tablet Take 2 tablets (650 mg total) by mouth every 4 (four) hours as needed (for pain scale < 4). 03/29/20   Taam-Akelman, Griselda Miner, MD  cetirizine (ZYRTEC ALLERGY) 10 MG tablet Take 1 tablet (10 mg total) by mouth at bedtime. 04/15/21   Rushie Chestnut, PA-C  cyclobenzaprine (FLEXERIL)  10 MG tablet Take 1 tablet (10 mg total) by mouth 2 (two) times daily as needed for muscle spasms. Patient not taking: Reported on 03/28/2020 03/08/20   Sharyon Cable, CNM  docusate sodium (COLACE) 50 MG capsule Take 1 capsule (50 mg total) by mouth 2 (two) times daily. 03/29/20   Taam-Akelman, Griselda Miner, MD  Elastic Bandages & Supports (COMFORT FIT MATERNITY SUPP LG) MISC 1 Units by Does not apply route daily. 03/08/20   Sharyon Cable, CNM  famotidine (PEPCID) 20 MG tablet Take 1 tablet (20 mg total) by mouth 2 (two) times daily. 04/15/21   Rushie Chestnut, PA-C  ferrous sulfate (FERROUSUL) 325 (65 FE) MG tablet Take 1 tablet (325 mg total) by mouth daily with breakfast. 03/29/20   Taam-Akelman, Griselda Miner, MD  ibuprofen (ADVIL) 600 MG tablet Take 1 tablet (600 mg total) by mouth every 6 (six) hours. 03/29/20   Taam-Akelman, Griselda Miner, MD  loratadine (CLARITIN) 10 MG tablet Take 1 tablet (10 mg total) by mouth daily as needed for allergies. Patient not taking: Reported on 03/28/2020 02/23/16   Sudie Grumbling, NP  metroNIDAZOLE (FLAGYL) 500 MG tablet Take 1 tablet (500 mg total) by mouth 2 (two) times daily. 04/05/22   White, Elita Boone, NP  ondansetron (ZOFRAN-ODT) 8 MG disintegrating tablet Take 1 tablet (8 mg total) by mouth every 8 (eight) hours as needed for nausea. Patient not taking: Reported on 03/28/2020 08/10/19   Elvina Sidle, MD  oxyCODONE-acetaminophen (PERCOCET/ROXICET) 5-325 MG tablet Take 2 tablets by mouth every 4 (four) hours as needed (pain scale > 7). 03/29/20   Taam-Akelman, Griselda Miner, MD  Prenatal Vit-Fe Fumarate-FA (PRENATAL 19) 29-1 MG CHEW Chew 1 tablet by mouth every morning. 08/10/19   Elvina Sidle, MD    Family History Family History  Problem Relation Age of Onset   Hypertension Mother    Hypertension Father    Allergies Sister     Social History Social History   Tobacco Use   Smoking status: Every Day    Types: Cigars   Smokeless tobacco: Never   Vaping Use   Vaping Use: Never used  Substance Use Topics   Alcohol use: No   Drug use: No     Allergies   Azo urinary tract support   Review of Systems Review of Systems  Gastrointestinal:  Positive for abdominal pain (RLQ).     Physical Exam Triage Vital Signs ED Triage Vitals  Enc Vitals Group     BP 05/03/22 1518 120/68     Pulse Rate 05/03/22 1518 75     Resp 05/03/22 1518 16     Temp 05/03/22 1518 98.5 F (36.9 C)     Temp Source 05/03/22 1518 Oral     SpO2 05/03/22 1518 98 %     Weight 05/03/22 1522 200 lb (90.7 kg)     Height 05/03/22 1522 5\' 2"  (1.575 m)     Head Circumference --      Peak Flow --      Pain Score 05/03/22 1522 5     Pain Loc --      Pain Edu? --      Excl. in GC? --    No data found.  Updated Vital Signs BP 120/68 (BP Location: Right Arm)   Pulse 75   Temp 98.5 F (36.9 C) (Oral)   Resp 16   Ht 5\' 2"  (1.575 m)   Wt 200 lb (90.7 kg)   LMP 03/27/2022 (Exact Date)   SpO2 98%   BMI 36.58 kg/m   Visual Acuity Right Eye Distance:   Left Eye Distance:   Bilateral Distance:    Right Eye Near:   Left Eye Near:    Bilateral Near:     Physical Exam Constitutional:      Appearance: She is well-developed.  Abdominal:     General: Abdomen is flat. Bowel sounds are normal.     Palpations: Abdomen is soft.     Tenderness: There is abdominal tenderness in the suprapubic area.       Comments: Abdomen: Mild right lower quadrant pain palpated along the ovary area.  Neurological:     Mental Status: She is alert.      UC Treatments / Results  Labs (all labs ordered are listed, but only abnormal results are displayed) Labs Reviewed  CERVICOVAGINAL ANCILLARY ONLY    EKG   Radiology No results found.  Procedures Procedures (including critical care time)  Medications Ordered in UC Medications - No data to display  Initial Impression / Assessment and Plan / UC Course  I have reviewed the  triage vital signs and the  nursing notes.  Pertinent labs & imaging results that were available during my care of the patient were reviewed by me and considered in my medical decision making (see chart for details).    Plan: 1.  STI testing is pending. 2.  Patient will be notified if test is positive and appropriate treatment initiated. 3.  Advised to follow-up PCP or return to urgent care if symptoms fail to improve. Final Clinical Impressions(s) / UC Diagnoses   Final diagnoses:  Right lower quadrant abdominal pain  Vaginal discharge     Discharge Instructions      Labs will be completed in 48 hours.  If you do not get a call from this office that indicates the labs are negative or normal.  You can look at my chart and see labs posted at 24 to 48 hours. Advised to follow-up PCP or return to urgent care if symptoms fail to improve.    ED Prescriptions   None    PDMP not reviewed this encounter.   Ellsworth Lennox, PA-C 05/03/22 1547

## 2022-05-04 ENCOUNTER — Telehealth (HOSPITAL_COMMUNITY): Payer: Self-pay | Admitting: Emergency Medicine

## 2022-05-04 LAB — CERVICOVAGINAL ANCILLARY ONLY
Bacterial Vaginitis (gardnerella): POSITIVE — AB
Candida Glabrata: NEGATIVE
Candida Vaginitis: NEGATIVE
Chlamydia: NEGATIVE
Comment: NEGATIVE
Comment: NEGATIVE
Comment: NEGATIVE
Comment: NEGATIVE
Comment: NEGATIVE
Comment: NORMAL
Neisseria Gonorrhea: NEGATIVE
Trichomonas: NEGATIVE

## 2022-05-04 MED ORDER — METRONIDAZOLE 500 MG PO TABS: 500.0000 mg | ORAL_TABLET | Freq: Two times a day (BID) | ORAL | 0 refills | Status: DC

## 2022-07-14 ENCOUNTER — Ambulatory Visit (HOSPITAL_COMMUNITY)
Admission: EM | Admit: 2022-07-14 | Discharge: 2022-07-14 | Disposition: A | Discharge status: Home / Self Care | Payer: Medicaid Other | Attending: Emergency Medicine | Admitting: Emergency Medicine

## 2022-07-14 ENCOUNTER — Encounter (HOSPITAL_COMMUNITY): Payer: Self-pay

## 2022-07-14 DIAGNOSIS — N898 Other specified noninflammatory disorders of vagina: Secondary | ICD-10-CM | POA: Diagnosis present

## 2022-07-14 LAB — HIV ANTIBODY (ROUTINE TESTING W REFLEX): HIV Screen 4th Generation wRfx: NONREACTIVE

## 2022-07-14 MED ORDER — METRONIDAZOLE 500 MG PO TABS: 500.0000 mg | ORAL_TABLET | Freq: Two times a day (BID) | ORAL | 0 refills | Status: DC

## 2022-07-14 NOTE — ED Provider Notes (Signed)
[facility]    CSN: [id number] Arrival date & time: 07/14/22  1754      History   Chief Complaint Chief Complaint  Patient presents with   Vaginal Discharge    HPI Andrea Lynch is a 26 y.o. female.  Patient reports vaginal discharge that started 3 to 4 days ago.  Patient reports a fishy smelling discharge.  Patient reports a light brown color to discharge.  Patient reports a new female sexual partner with condom use sometimes.  Patient reports this seems like a previous episode where she had BV.  Patient has not used any medications for symptoms.  Patient denies any knowledge of a STD exposure.  Patient is requesting HIV and syphilis testing.    Vaginal Discharge Associated symptoms: no abdominal pain, no dyspareunia, no dysuria, no fever, no nausea and no vomiting     Past Medical History:  Diagnosis Date   HSV (herpes simplex virus) infection    Medical history non-contributory     Patient Active Problem List   Diagnosis Date Noted   Normal labor 03/27/2020   Status post normal vaginal delivery 11/20/2014   Non-reactive NST (non-stress test) 11/18/2014    Past Surgical History:  Procedure Laterality Date   NO PAST SURGERIES      OB History     Gravida  2   Para  2   Term  2   Preterm      AB      Living  2      SAB      IAB      Ectopic      Multiple  0   Live Births  2            Home Medications    Prior to Admission medications   Medication Sig Start Date End Date Taking? Authorizing Provider  metroNIDAZOLE (FLAGYL) 500 MG tablet Take 1 tablet (500 mg total) by mouth 2 (two) times daily. 07/14/22  Yes Debby Freiberg, NP  acetaminophen (TYLENOL) 325 MG tablet Take 2 tablets (650 mg total) by mouth every 4 (four) hours as needed (for pain scale < 4). 03/29/20   Taam-Akelman, Griselda Miner, MD  cetirizine (ZYRTEC ALLERGY) 10 MG tablet Take 1 tablet (10 mg total) by mouth at bedtime. 04/15/21   Rushie Chestnut, PA-C  docusate  sodium (COLACE) 50 MG capsule Take 1 capsule (50 mg total) by mouth 2 (two) times daily. 03/29/20   Taam-Akelman, Griselda Miner, MD  Elastic Bandages & Supports (COMFORT FIT MATERNITY SUPP LG) MISC 1 Units by Does not apply route daily. 03/08/20   Sharyon Cable, CNM  famotidine (PEPCID) 20 MG tablet Take 1 tablet (20 mg total) by mouth 2 (two) times daily. 04/15/21   Rushie Chestnut, PA-C  ferrous sulfate (FERROUSUL) 325 (65 FE) MG tablet Take 1 tablet (325 mg total) by mouth daily with breakfast. 03/29/20   Taam-Akelman, Griselda Miner, MD  ibuprofen (ADVIL) 600 MG tablet Take 1 tablet (600 mg total) by mouth every 6 (six) hours. 03/29/20   Taam-Akelman, Griselda Miner, MD  oxyCODONE-acetaminophen (PERCOCET/ROXICET) 5-325 MG tablet Take 2 tablets by mouth every 4 (four) hours as needed (pain scale > 7). 03/29/20   Taam-Akelman, Griselda Miner, MD  Prenatal Vit-Fe Fumarate-FA (PRENATAL 19) 29-1 MG CHEW Chew 1 tablet by mouth every morning. 08/10/19   Elvina Sidle, MD    Family History Family History  Problem Relation Age of Onset   Hypertension Mother  Hypertension Father    Allergies Sister     Social History Social History   Tobacco Use   Smoking status: Every Day    Types: Cigars   Smokeless tobacco: Never  Vaping Use   Vaping Use: Never used  Substance Use Topics   Alcohol use: No   Drug use: No     Allergies   Azo urinary tract support   Review of Systems Review of Systems  Constitutional:  Negative for chills and fever.  Gastrointestinal:  Negative for abdominal pain, constipation, diarrhea, nausea and vomiting.  Genitourinary:  Positive for vaginal discharge. Negative for decreased urine volume, difficulty urinating, dyspareunia, dysuria, flank pain, frequency, genital sores, hematuria, menstrual problem, pelvic pain, urgency, vaginal bleeding and vaginal pain.     Physical Exam Triage Vital Signs ED Triage Vitals  Enc Vitals Group     BP 07/14/22 1923 128/78     Pulse  Rate 07/14/22 1923 66     Resp 07/14/22 1923 12     Temp 07/14/22 1923 98.9 F (37.2 C)     Temp Source 07/14/22 1923 Oral     SpO2 07/14/22 1923 100 %     Weight --      Height --      Head Circumference --      Peak Flow --      Pain Score 07/14/22 1919 0     Pain Loc --      Pain Edu? --      Excl. in GC? --    No data found.  Updated Vital Signs BP 128/78 (BP Location: Left Arm)   Pulse 66   Temp 98.9 F (37.2 C) (Oral)   Resp 12   LMP 07/11/2022   SpO2 100%   Physical Exam Vitals and nursing note reviewed.  Constitutional:      Appearance: Normal appearance.  Genitourinary:    Comments: Patient deferred exam  Neurological:     Mental Status: She is alert.  Psychiatric:        Behavior: Behavior is cooperative.      UC Treatments / Results  Labs (all labs ordered are listed, but only abnormal results are displayed) Labs Reviewed  RPR  HIV ANTIBODY (ROUTINE TESTING W REFLEX)  CERVICOVAGINAL ANCILLARY ONLY    EKG   Radiology No results found.  Procedures Procedures (including critical care time)  Medications Ordered in UC Medications - No data to display  Initial Impression / Assessment and Plan / UC Course  I have reviewed the triage vital signs and the nursing notes.  Pertinent labs & imaging results that were available during my care of the patient were reviewed by me and considered in my medical decision making (see chart for details).     Patient was evaluated for vaginal discharge.  Based on symptomology and patient's reported medical history, she was prescribed Flagyl.  Patient was made aware of medication regimen and possible side effects of medication.  Vaginal swab, RPR, and HIV are pending.  Patient was made aware of results reporting protocol and MyChart.  Patient verbalized understanding of instructions.  Final Clinical Impressions(s) / UC Diagnoses   Final diagnoses:  Vaginal discharge     Discharge Instructions      We  will call you if any of your test results are positive.  You can view these test results on MyChart. please refrain from any sexual activity at this time while taking the antibiotic and awaiting for your test results.  Flagyl has been sent to the pharmacy, take this medication 2 times daily for the next 7 days.  Please refrain from any alcohol intake at this time while taking this antibiotic.      ED Prescriptions     Medication Sig Dispense Auth. Provider   metroNIDAZOLE (FLAGYL) 500 MG tablet Take 1 tablet (500 mg total) by mouth 2 (two) times daily. 14 tablet Flossie Dibble, NP      PDMP not reviewed this encounter.   Flossie Dibble, NP 07/14/22 2006

## 2022-07-14 NOTE — Discharge Instructions (Addendum)
We will call you if any of your test results are positive.  You can view these test results on MyChart. please refrain from any sexual activity at this time while taking the antibiotic and awaiting for your test results.   Flagyl has been sent to the pharmacy, take this medication 2 times daily for the next 7 days.  Please refrain from any alcohol intake at this time while taking this antibiotic.

## 2022-07-14 NOTE — ED Triage Notes (Signed)
Pt is here for vaginal discharge and stomach pain x4days

## 2022-07-15 LAB — RPR: RPR Ser Ql: NONREACTIVE

## 2022-07-17 LAB — CERVICOVAGINAL ANCILLARY ONLY
Bacterial Vaginitis (gardnerella): POSITIVE — AB
Candida Glabrata: NEGATIVE
Candida Vaginitis: NEGATIVE
Chlamydia: NEGATIVE
Comment: NEGATIVE
Comment: NEGATIVE
Comment: NEGATIVE
Comment: NEGATIVE
Comment: NEGATIVE
Comment: NORMAL
Neisseria Gonorrhea: NEGATIVE
Trichomonas: NEGATIVE

## 2022-07-23 ENCOUNTER — Encounter (HOSPITAL_COMMUNITY): Payer: Self-pay

## 2022-07-23 ENCOUNTER — Ambulatory Visit (HOSPITAL_COMMUNITY)
Admission: EM | Admit: 2022-07-23 | Discharge: 2022-07-23 | Disposition: A | Discharge status: Home / Self Care | Payer: Medicaid Other | Attending: Internal Medicine | Admitting: Internal Medicine

## 2022-07-23 DIAGNOSIS — N898 Other specified noninflammatory disorders of vagina: Secondary | ICD-10-CM | POA: Diagnosis present

## 2022-07-23 MED ORDER — FLUCONAZOLE 150 MG PO TABS: 150.0000 mg | ORAL_TABLET | ORAL | 0 refills | Status: DC

## 2022-07-23 NOTE — ED Triage Notes (Signed)
Patient was on antibiotics for BV. After finishing states she was having yeast symptoms. No diarrhea.   Patient having vaginal discharge and itching.

## 2022-07-23 NOTE — ED Provider Notes (Signed)
[place]    CSN: [id number] Arrival date & time: 07/23/22  1118      History   Chief Complaint Chief Complaint  Patient presents with   Abdominal Pain   Vaginal Discharge    HPI Andrea Lynch is a 26 y.o. female.   Patient presents urgent care for evaluation of thick white vaginal discharge with associated vaginal itching that started a few days after she started taking Flagyl for bacterial vaginosis.  She states this is never happened before when she has taken an antibiotic but she believes that it is due to antibiotic therapy.  She is not a diabetic and denies abdominal pain, urinary symptoms, vaginal odor, and vaginal rash.  No concern for STD today.  Last menstrual cycle was July 11, 2022.  Has not attempted use of any over-the-counter medications prior to arrival urgent care for her vaginal symptoms.   Abdominal Pain Associated symptoms: vaginal discharge   Vaginal Discharge Associated symptoms: abdominal pain     Past Medical History:  Diagnosis Date   HSV (herpes simplex virus) infection    Medical history non-contributory     Patient Active Problem List   Diagnosis Date Noted   Normal labor 03/27/2020   Status post normal vaginal delivery 11/20/2014   Non-reactive NST (non-stress test) 11/18/2014    Past Surgical History:  Procedure Laterality Date   NO PAST SURGERIES      OB History     Gravida  2   Para  2   Term  2   Preterm      AB      Living  2      SAB      IAB      Ectopic      Multiple  0   Live Births  2            Home Medications    Prior to Admission medications   Medication Sig Start Date End Date Taking? Authorizing Provider  fluconazole (DIFLUCAN) 150 MG tablet Take 1 tablet (150 mg total) by mouth every 3 (three) days. 07/23/22  Yes Talbot Grumbling, FNP  acetaminophen (TYLENOL) 325 MG tablet Take 2 tablets (650 mg total) by mouth every 4 (four) hours as needed (for pain scale < 4).  03/29/20   Taam-Akelman, Lawrence Santiago, MD  cetirizine (ZYRTEC ALLERGY) 10 MG tablet Take 1 tablet (10 mg total) by mouth at bedtime. 04/15/21   Hughie Closs, PA-C  docusate sodium (COLACE) 50 MG capsule Take 1 capsule (50 mg total) by mouth 2 (two) times daily. 03/29/20   Taam-Akelman, Lawrence Santiago, MD  Elastic Bandages & Supports (COMFORT FIT MATERNITY SUPP LG) MISC 1 Units by Does not apply route daily. 03/08/20   Lajean Manes, CNM  famotidine (PEPCID) 20 MG tablet Take 1 tablet (20 mg total) by mouth 2 (two) times daily. 04/15/21   Hughie Closs, PA-C  ferrous sulfate (FERROUSUL) 325 (65 FE) MG tablet Take 1 tablet (325 mg total) by mouth daily with breakfast. 03/29/20   Taam-Akelman, Lawrence Santiago, MD  ibuprofen (ADVIL) 600 MG tablet Take 1 tablet (600 mg total) by mouth every 6 (six) hours. 03/29/20   Taam-Akelman, Lawrence Santiago, MD  metroNIDAZOLE (FLAGYL) 500 MG tablet Take 1 tablet (500 mg total) by mouth 2 (two) times daily. 07/14/22   Flossie Dibble, NP  oxyCODONE-acetaminophen (PERCOCET/ROXICET) 5-325 MG tablet Take 2 tablets by mouth every 4 (four) hours as needed (pain scale >  7). 03/29/20   Taam-Akelman, Griselda Miner, MD  Prenatal Vit-Fe Fumarate-FA (PRENATAL 19) 29-1 MG CHEW Chew 1 tablet by mouth every morning. 08/10/19   Elvina Sidle, MD    Family History Family History  Problem Relation Age of Onset   Hypertension Mother    Hypertension Father    Allergies Sister     Social History Social History   Tobacco Use   Smoking status: Every Day    Types: Cigars   Smokeless tobacco: Never  Vaping Use   Vaping Use: Never used  Substance Use Topics   Alcohol use: No   Drug use: No     Allergies   Azo urinary tract support   Review of Systems Review of Systems  Gastrointestinal:  Positive for abdominal pain.  Genitourinary:  Positive for vaginal discharge.  Per HPI   Physical Exam Triage Vital Signs ED Triage Vitals  Enc Vitals Group     BP 07/23/22 1203 115/63      Pulse Rate 07/23/22 1203 93     Resp 07/23/22 1203 16     Temp 07/23/22 1203 98.3 F (36.8 C)     Temp Source 07/23/22 1203 Oral     SpO2 07/23/22 1203 98 %     Weight --      Height --      Head Circumference --      Peak Flow --      Pain Score 07/23/22 1202 2     Pain Loc --      Pain Edu? --      Excl. in GC? --    No data found.  Updated Vital Signs BP 115/63 (BP Location: Left Arm)   Pulse 93   Temp 98.3 F (36.8 C) (Oral)   Resp 16   LMP 07/11/2022 (Exact Date)   SpO2 98%   Visual Acuity Right Eye Distance:   Left Eye Distance:   Bilateral Distance:    Right Eye Near:   Left Eye Near:    Bilateral Near:     Physical Exam Vitals and nursing note reviewed.  Constitutional:      Appearance: She is not ill-appearing or toxic-appearing.  HENT:     Head: Normocephalic and atraumatic.     Right Ear: Hearing and external ear normal.     Left Ear: Hearing and external ear normal.     Nose: Nose normal.     Mouth/Throat:     Lips: Pink.  Eyes:     General: Lids are normal. Vision grossly intact. Gaze aligned appropriately.     Extraocular Movements: Extraocular movements intact.     Conjunctiva/sclera: Conjunctivae normal.  Pulmonary:     Effort: Pulmonary effort is normal.  Genitourinary:    Comments: Deferred. Musculoskeletal:     Cervical back: Neck supple.  Skin:    General: Skin is warm and dry.     Capillary Refill: Capillary refill takes less than 2 seconds.     Findings: No rash.  Neurological:     General: No focal deficit present.     Mental Status: She is alert and oriented to person, place, and time. Mental status is at baseline.     Cranial Nerves: No dysarthria or facial asymmetry.  Psychiatric:        Mood and Affect: Mood normal.        Speech: Speech normal.        Behavior: Behavior normal.        Thought  Content: Thought content normal.        Judgment: Judgment normal.      UC Treatments / Results  Labs (all labs  ordered are listed, but only abnormal results are displayed) Labs Reviewed  CERVICOVAGINAL ANCILLARY ONLY    EKG   Radiology No results found.  Procedures Procedures (including critical care time)  Medications Ordered in UC Medications - No data to display  Initial Impression / Assessment and Plan / UC Course  I have reviewed the triage vital signs and the nursing notes.  Pertinent labs & imaging results that were available during my care of the patient were reviewed by me and considered in my medical decision making (see chart for details).   1.  Vaginal discharge and vaginal itching Presentation is consistent with an acute vaginal yeast infection.  We will treat this empirically with Diflucan once today then again in 3 days.  STI testing pending and will come back in the next few days.  We will notify patient of any other positive results and call her at that time if she requires further treatment based on results.  If there is no change in treatment plan, patient will not be called.  She verbalizes understanding of this and prescription sent to pharmacy.   Discussed physical exam and available lab work findings in clinic with patient.  Counseled patient regarding appropriate use of medications and potential side effects for all medications recommended or prescribed today. Discussed red flag signs and symptoms of worsening condition,when to call the PCP office, return to urgent care, and when to seek higher level of care in the emergency department. Patient verbalizes understanding and agreement with plan. All questions answered. Patient discharged in stable condition.    Final Clinical Impressions(s) / UC Diagnoses   Final diagnoses:  Vaginal discharge  Vaginal itching     Discharge Instructions      Take Diflucan once today then again in 3 days (on Wednesday).  This will treat your vaginal yeast infection.  The swab that we did today will come back in the next 2 to 3  days and we will treat you for anything else that is positive on that swab if necessary when it comes back.  You will only hear from [place] if the results of the vaginal swab change our current treatment plan.  Return to urgent care as needed.     ED Prescriptions     Medication Sig Dispense Auth. Provider   fluconazole (DIFLUCAN) 150 MG tablet Take 1 tablet (150 mg total) by mouth every 3 (three) days. 2 tablet Talbot Grumbling, FNP      PDMP not reviewed this encounter.   Talbot Grumbling, [place] 07/23/22 1220

## 2022-07-23 NOTE — Discharge Instructions (Signed)
Take Diflucan once today then again in 3 days (on Wednesday).  This will treat your vaginal yeast infection.  The swab that we did today will come back in the next 2 to 3 days and we will treat you for anything else that is positive on that swab if necessary when it comes back.  You will only hear from [place] if the results of the vaginal swab change our current treatment plan.  Return to urgent care as needed.

## 2022-07-24 LAB — CERVICOVAGINAL ANCILLARY ONLY
Bacterial Vaginitis (gardnerella): POSITIVE — AB
Candida Glabrata: NEGATIVE
Candida Vaginitis: POSITIVE — AB
Chlamydia: NEGATIVE
Comment: NEGATIVE
Comment: NEGATIVE
Comment: NEGATIVE
Comment: NEGATIVE
Comment: NEGATIVE
Comment: NORMAL
Neisseria Gonorrhea: NEGATIVE
Trichomonas: NEGATIVE

## 2022-07-25 ENCOUNTER — Telehealth (HOSPITAL_COMMUNITY): Payer: Self-pay | Admitting: Emergency Medicine

## 2022-07-25 NOTE — Telephone Encounter (Signed)
Opened in error

## 2022-11-11 ENCOUNTER — Ambulatory Visit (HOSPITAL_COMMUNITY)
Admission: EM | Admit: 2022-11-11 | Discharge: 2022-11-11 | Disposition: A | Discharge status: Home / Self Care | Payer: Medicaid Other | Attending: Nurse Practitioner | Admitting: Nurse Practitioner

## 2022-11-11 ENCOUNTER — Encounter (HOSPITAL_COMMUNITY): Payer: Self-pay

## 2022-11-11 DIAGNOSIS — N898 Other specified noninflammatory disorders of vagina: Secondary | ICD-10-CM | POA: Insufficient documentation

## 2022-11-11 LAB — POCT URINALYSIS DIPSTICK, ED / UC
Bilirubin Urine: NEGATIVE
Glucose, UA: NEGATIVE mg/dL
Ketones, ur: NEGATIVE mg/dL
Leukocytes,Ua: NEGATIVE
Nitrite: NEGATIVE
Protein, ur: NEGATIVE mg/dL
Specific Gravity, Urine: 1.025 (ref 1.005–1.030)
Urobilinogen, UA: 0.2 mg/dL (ref 0.0–1.0)
pH: 6.5 (ref 5.0–8.0)

## 2022-11-11 NOTE — ED Provider Notes (Signed)
[facility]    CSN: [id number] Arrival date & time: 11/11/22  1119      History   Chief Complaint Chief Complaint  Patient presents with  . SEXUALLY TRANSMITTED DISEASE    HPI Andrea Lynch is a 27 y.o. female.   HPI  Past Medical History:  Diagnosis Date  . HSV (herpes simplex virus) infection   . Medical history non-contributory     Patient Active Problem List   Diagnosis Date Noted  . Normal labor 03/27/2020  . Status post normal vaginal delivery 11/20/2014  . Non-reactive NST (non-stress test) 11/18/2014    Past Surgical History:  Procedure Laterality Date  . NO PAST SURGERIES      OB History     Gravida  2   Para  2   Term  2   Preterm      AB      Living  2      SAB      IAB      Ectopic      Multiple  0   Live Births  2            Home Medications    Prior to Admission medications   Medication Sig Start Date End Date Taking? Authorizing Provider  acetaminophen (TYLENOL) 325 MG tablet Take 2 tablets (650 mg total) by mouth every 4 (four) hours as needed (for pain scale < 4). 03/29/20   Taam-Akelman, Lawrence Santiago, MD  cetirizine (ZYRTEC ALLERGY) 10 MG tablet Take 1 tablet (10 mg total) by mouth at bedtime. 04/15/21   Hughie Closs, PA-C  docusate sodium (COLACE) 50 MG capsule Take 1 capsule (50 mg total) by mouth 2 (two) times daily. 03/29/20   Taam-Akelman, Lawrence Santiago, MD  Elastic Bandages & Supports (COMFORT FIT MATERNITY SUPP LG) MISC 1 Units by Does not apply route daily. 03/08/20   Lajean Manes, CNM  famotidine (PEPCID) 20 MG tablet Take 1 tablet (20 mg total) by mouth 2 (two) times daily. 04/15/21   Hughie Closs, PA-C  ferrous sulfate (FERROUSUL) 325 (65 FE) MG tablet Take 1 tablet (325 mg total) by mouth daily with breakfast. 03/29/20   Taam-Akelman, Lawrence Santiago, MD  fluconazole (DIFLUCAN) 150 MG tablet Take 1 tablet (150 mg total) by mouth every 3 (three) days. 07/23/22   Talbot Grumbling, FNP  ibuprofen  (ADVIL) 600 MG tablet Take 1 tablet (600 mg total) by mouth every 6 (six) hours. 03/29/20   Taam-Akelman, Lawrence Santiago, MD  metroNIDAZOLE (FLAGYL) 500 MG tablet Take 1 tablet (500 mg total) by mouth 2 (two) times daily. 07/14/22   Flossie Dibble, NP  oxyCODONE-acetaminophen (PERCOCET/ROXICET) 5-325 MG tablet Take 2 tablets by mouth every 4 (four) hours as needed (pain scale > 7). 03/29/20   Taam-Akelman, Lawrence Santiago, MD  Prenatal Vit-Fe Fumarate-FA (PRENATAL 19) 29-1 MG CHEW Chew 1 tablet by mouth every morning. 08/10/19   Robyn Haber, MD    Family History Family History  Problem Relation Age of Onset  . Hypertension Mother   . Hypertension Father   . Allergies Sister     Social History Social History   Tobacco Use  . Smoking status: Every Day    Types: Cigars  . Smokeless tobacco: Never  Vaping Use  . Vaping Use: Never used  Substance Use Topics  . Alcohol use: No  . Drug use: No     Allergies   Azo urinary tract support   Review  of Systems Review of Systems   Physical Exam Triage Vital Signs ED Triage Vitals  Enc Vitals Group     BP 11/11/22 1152 108/73     Pulse Rate 11/11/22 1152 71     Resp 11/11/22 1152 16     Temp 11/11/22 1152 98.6 F (37 C)     Temp Source 11/11/22 1152 Oral     SpO2 11/11/22 1152 98 %     Weight 11/11/22 1150 204 lb (92.5 kg)     Height --      Head Circumference --      Peak Flow --      Pain Score 11/11/22 1150 0     Pain Loc --      Pain Edu? --      Excl. in [place]? --    No data found.  Updated Vital Signs BP 108/73 (BP Location: Left Arm)   Pulse 71   Temp 98.6 F (37 C) (Oral)   Resp 16   Wt 204 lb (92.5 kg)   LMP 10/29/2022 (Approximate)   SpO2 98%   BMI 37.31 kg/m   Visual Acuity Right Eye Distance:   Left Eye Distance:   Bilateral Distance:    Right Eye Near:   Left Eye Near:    Bilateral Near:     Physical Exam   UC Treatments / Results  Labs (all labs ordered are listed, but only abnormal results  are displayed) Labs Reviewed - No data to display  EKG   Radiology No results found.  Procedures Procedures (including critical care time)  Medications Ordered in UC Medications - No data to display  Initial Impression / Assessment and Plan / UC Course  I have reviewed the triage vital signs and the nursing notes.  Pertinent labs & imaging results that were available during my care of the patient were reviewed by me and considered in my medical decision making (see chart for details).     Vaginal discharge Final Clinical Impressions(s) / UC Diagnoses   Final diagnoses:  None   Discharge Instructions   None    ED Prescriptions   None    PDMP not reviewed this encounter.

## 2022-11-11 NOTE — ED Triage Notes (Signed)
Pt states that she is here for STD check. Sx vaginal discharge. Few days. Arm is no longer hurting. Thinks its from [facility].

## 2022-11-11 NOTE — Discharge Instructions (Addendum)
Your NuSwab is pending. Your urinalysis is negative.You were also evaluated for any sexually transmitted infections the results will result in your MyChart. A nurse will follow up with you for any positive results for further treatment recommendation. If your symptoms persist or get worse please follow up here or with your gynecologist for further evaluation

## 2022-11-13 LAB — CERVICOVAGINAL ANCILLARY ONLY
Bacterial Vaginitis (gardnerella): POSITIVE — AB
Candida Glabrata: NEGATIVE
Candida Vaginitis: NEGATIVE
Chlamydia: NEGATIVE
Comment: NEGATIVE
Comment: NEGATIVE
Comment: NEGATIVE
Comment: NEGATIVE
Comment: NEGATIVE
Comment: NORMAL
Neisseria Gonorrhea: NEGATIVE
Trichomonas: NEGATIVE

## 2022-11-14 ENCOUNTER — Telehealth (HOSPITAL_COMMUNITY): Payer: Self-pay | Admitting: Emergency Medicine

## 2022-11-15 MED ORDER — METRONIDAZOLE 500 MG PO TABS: 500.0000 mg | ORAL_TABLET | Freq: Two times a day (BID) | ORAL | 0 refills | Status: DC

## 2023-02-18 ENCOUNTER — Ambulatory Visit (HOSPITAL_COMMUNITY): Payer: Self-pay

## 2023-02-19 ENCOUNTER — Telehealth (HOSPITAL_COMMUNITY): Payer: Self-pay | Admitting: Emergency Medicine

## 2023-02-19 NOTE — Telephone Encounter (Signed)
Patient left voicemail asking for refill on MEtronidazole Called to update her that she will need to be seen, patient verbalized understanding

## 2023-02-20 ENCOUNTER — Encounter (HOSPITAL_COMMUNITY): Payer: Self-pay

## 2023-02-20 ENCOUNTER — Ambulatory Visit (HOSPITAL_COMMUNITY)
Admission: RE | Admit: 2023-02-20 | Discharge: 2023-02-20 | Disposition: A | Discharge status: Home / Self Care | Payer: Self-pay | Source: Ambulatory Visit | Attending: Family Medicine | Admitting: Family Medicine

## 2023-02-20 VITALS — BP 108/73 | HR 74 | Temp 98.7°F | Resp 17

## 2023-02-20 DIAGNOSIS — N760 Acute vaginitis: Secondary | ICD-10-CM | POA: Insufficient documentation

## 2023-02-20 MED ORDER — METRONIDAZOLE 500 MG PO TABS: 500.0000 mg | ORAL_TABLET | Freq: Two times a day (BID) | ORAL | 0 refills | Status: AC

## 2023-02-20 NOTE — ED Provider Notes (Signed)
[facility]    CSN: [id number] Arrival date & time: 02/20/23  1553      History   Chief Complaint Chief Complaint  Patient presents with   Vaginal Discharge    HPI Andrea Lynch is a 27 y.o. female.    Vaginal Discharge  Here for vaginal discharge has been going on about 2 days.  She does have some odor but no itching.  No abdominal pain or fever.  Last menstrual cycle was May 17.     she denies any allergies to me today   HIV and RPR were negative in November 2023  Past Medical History:  Diagnosis Date   HSV (herpes simplex virus) infection    Medical history non-contributory     Patient Active Problem List   Diagnosis Date Noted   Normal labor 03/27/2020   Status post normal vaginal delivery 11/20/2014   Non-reactive NST (non-stress test) 11/18/2014    Past Surgical History:  Procedure Laterality Date   NO PAST SURGERIES      OB History     Gravida  2   Para  2   Term  2   Preterm      AB      Living  2      SAB      IAB      Ectopic      Multiple  0   Live Births  2            Home Medications    Prior to Admission medications   Medication Sig Start Date End Date Taking? Authorizing Provider  metroNIDAZOLE (FLAGYL) 500 MG tablet Take 1 tablet (500 mg total) by mouth 2 (two) times daily for 7 days. 02/20/23 02/27/23 Yes Peng Thorstenson, Janace Aris, MD    Family History Family History  Problem Relation Age of Onset   Hypertension Mother    Hypertension Father    Allergies Sister     Social History Social History   Tobacco Use   Smoking status: Every Day    Types: Cigars   Smokeless tobacco: Never  Vaping Use   Vaping Use: Never used  Substance Use Topics   Alcohol use: No   Drug use: No     Allergies   Azo urinary tract support   Review of Systems Review of Systems  Genitourinary:  Positive for vaginal discharge.     Physical Exam Triage Vital Signs ED Triage Vitals [02/20/23 1607]  Enc Vitals  Group     BP 108/73     Pulse Rate 74     Resp 17     Temp 98.7 F (37.1 C)     Temp Source Oral     SpO2 96 %     Weight      Height      Head Circumference      Peak Flow      Pain Score 0     Pain Loc      Pain Edu?      Excl. in GC?    No data found.  Updated Vital Signs BP 108/73 (BP Location: Left Arm)   Pulse 74   Temp 98.7 F (37.1 C) (Oral)   Resp 17   LMP 01/26/2023   SpO2 96%   Visual Acuity Right Eye Distance:   Left Eye Distance:   Bilateral Distance:    Right Eye Near:   Left Eye Near:    Bilateral Near:  Physical Exam Vitals reviewed.  Constitutional:      General: She is not in acute distress.    Appearance: She is not ill-appearing, toxic-appearing or diaphoretic.  HENT:     Mouth/Throat:     Mouth: Mucous membranes are moist.     Pharynx: No oropharyngeal exudate or posterior oropharyngeal erythema.  Eyes:     Extraocular Movements: Extraocular movements intact.     Conjunctiva/sclera: Conjunctivae normal.     Pupils: Pupils are equal, round, and reactive to light.  Cardiovascular:     Rate and Rhythm: Normal rate and regular rhythm.     Heart sounds: No murmur heard. Pulmonary:     Effort: No respiratory distress.     Breath sounds: No stridor. No wheezing, rhonchi or rales.  Musculoskeletal:     Cervical back: Neck supple.  Lymphadenopathy:     Cervical: No cervical adenopathy.  Skin:    Capillary Refill: Capillary refill takes less than 2 seconds.     Coloration: Skin is not jaundiced or pale.  Neurological:     General: No focal deficit present.     Mental Status: She is alert and oriented to person, place, and time.  Psychiatric:        Behavior: Behavior normal.      UC Treatments / Results  Labs (all labs ordered are listed, but only abnormal results are displayed) Labs Reviewed  CERVICOVAGINAL ANCILLARY ONLY    EKG   Radiology No results found.  Procedures Procedures (including critical care  time)  Medications Ordered in UC Medications - No data to display  Initial Impression / Assessment and Plan / UC Course  I have reviewed the triage vital signs and the nursing notes.  Pertinent labs & imaging results that were available during my care of the patient were reviewed by me and considered in my medical decision making (see chart for details).        Vaginal self swab is done.  Empiric treatment is sent in for BV.  Will notify her and treat per protocol any positives on the swab; she declines HIV and RPR testing today Final Clinical Impressions(s) / UC Diagnoses   Final diagnoses:  Acute vaginitis     Discharge Instructions      Staff will let she know if there is anything positive on your swab  Take metronidazole 500 mg--1 tablet 2 times daily for 7 days.  Avoid drinking alcohol within 72 hours of taking this medication; this is for potential BV     ED Prescriptions     Medication Sig Dispense Auth. Provider   metroNIDAZOLE (FLAGYL) 500 MG tablet Take 1 tablet (500 mg total) by mouth 2 (two) times daily for 7 days. 14 tablet Jaire Pinkham, Janace Aris, MD      PDMP not reviewed this encounter.   Zenia Resides, MD 02/20/23 1623

## 2023-02-20 NOTE — Discharge Instructions (Addendum)
Staff will let she know if there is anything positive on your swab  Take metronidazole 500 mg--1 tablet 2 times daily for 7 days.  Avoid drinking alcohol within 72 hours of taking this medication; this is for potential BV

## 2023-02-20 NOTE — ED Triage Notes (Signed)
Pt reports that she would like STD testing as for a couple days had vaginal discharge that has an odor.

## 2023-02-21 LAB — CERVICOVAGINAL ANCILLARY ONLY
Bacterial Vaginitis (gardnerella): POSITIVE — AB
Candida Glabrata: NEGATIVE
Candida Vaginitis: NEGATIVE
Chlamydia: NEGATIVE
Comment: NEGATIVE
Comment: NEGATIVE
Comment: NEGATIVE
Comment: NEGATIVE
Comment: NEGATIVE
Comment: NORMAL
Neisseria Gonorrhea: NEGATIVE
Trichomonas: NEGATIVE

## 2023-03-07 ENCOUNTER — Telehealth (HOSPITAL_COMMUNITY): Payer: Self-pay | Admitting: Emergency Medicine

## 2023-03-07 NOTE — Telephone Encounter (Signed)
Patient returned call, she was informed and verbalized understanding of need for re-evaluation

## 2023-03-07 NOTE — Telephone Encounter (Signed)
Patient left voicemail requesting refill on metronidazole, states "it didn't work" the first time Attempted to notify patient she will need to be re-evaluated, VM is full

## 2023-03-08 ENCOUNTER — Ambulatory Visit (HOSPITAL_COMMUNITY)
Admission: RE | Admit: 2023-03-08 | Discharge: 2023-03-08 | Disposition: A | Discharge status: Home / Self Care | Payer: Self-pay | Source: Ambulatory Visit | Attending: Internal Medicine | Admitting: Internal Medicine

## 2023-03-08 ENCOUNTER — Encounter (HOSPITAL_COMMUNITY): Payer: Self-pay

## 2023-03-08 VITALS — BP 107/73 | HR 67 | Temp 98.9°F | Resp 18

## 2023-03-08 DIAGNOSIS — N760 Acute vaginitis: Secondary | ICD-10-CM | POA: Insufficient documentation

## 2023-03-08 MED ORDER — METRONIDAZOLE 500 MG PO TABS: 500.0000 mg | ORAL_TABLET | Freq: Two times a day (BID) | ORAL | 0 refills | Status: DC

## 2023-03-08 NOTE — Discharge Instructions (Addendum)
This take medications as per scribed Please avoid alcohol intake whilst you are taking the medications because drinking alcohol while you are taking metronidazole will make you very nauseated. Will call you with recommendations if labs are abnormal Return to urgent care if symptoms worsen.

## 2023-03-08 NOTE — ED Triage Notes (Signed)
Pt states that she has been having vaginal discharge x couple days and would like to be treated for BV

## 2023-03-08 NOTE — ED Provider Notes (Signed)
[facility]    CSN: [id number] Arrival date & time: 03/08/23  1305      History   Chief Complaint Chief Complaint  Patient presents with   Vaginal Discharge    HPI Andrea Lynch is a 27 y.o. female comes to the urgent care with 2-day history of low dryness whitish vaginal discharge.  Discharge is whitish in color and has a fishy smell.  No lower abdominal pain.  No dysuria urgency or frequency.  No fever or chills.  Patient's last menstrual period was less than 2 weeks ago.  Patient is sexually active.  She denies deep dyspareunia.  Patient was seen a couple weeks ago with similar symptoms.  Cervicovaginal swab was negative for STDs.  Cervicovaginal swab was significant for bacterial vaginosis.  HPI  Past Medical History:  Diagnosis Date   HSV (herpes simplex virus) infection    Medical history non-contributory     Patient Active Problem List   Diagnosis Date Noted   Normal labor 03/27/2020   Status post normal vaginal delivery 11/20/2014   Non-reactive NST (non-stress test) 11/18/2014    Past Surgical History:  Procedure Laterality Date   NO PAST SURGERIES      OB History     Gravida  2   Para  2   Term  2   Preterm      AB      Living  2      SAB      IAB      Ectopic      Multiple  0   Live Births  2            Home Medications    Prior to Admission medications   Medication Sig Start Date End Date Taking? Authorizing Provider  metroNIDAZOLE (FLAGYL) 500 MG tablet Take 1 tablet (500 mg total) by mouth 2 (two) times daily. 03/08/23  Yes Dene Landsberg, Britta Mccreedy, MD    Family History Family History  Problem Relation Age of Onset   Hypertension Mother    Hypertension Father    Allergies Sister     Social History Social History   Tobacco Use   Smoking status: Every Day    Types: Cigars   Smokeless tobacco: Never  Vaping Use   Vaping Use: Never used  Substance Use Topics   Alcohol use: No   Drug use: No      Allergies   Azo urinary tract support   Review of Systems Review of Systems As per HPI  Physical Exam Triage Vital Signs ED Triage Vitals  Enc Vitals Group     BP 03/08/23 1330 107/73     Pulse Rate 03/08/23 1330 67     Resp 03/08/23 1330 18     Temp 03/08/23 1330 98.9 F (37.2 C)     Temp Source 03/08/23 1330 Oral     SpO2 03/08/23 1330 98 %     Weight --      Height --      Head Circumference --      Peak Flow --      Pain Score 03/08/23 1329 0     Pain Loc --      Pain Edu? --      Excl. in GC? --    No data found.  Updated Vital Signs BP 107/73 (BP Location: Left Arm)   Pulse 67   Temp 98.9 F (37.2 C) (Oral)   Resp 18   LMP 02/26/2023 (  Exact Date)   SpO2 98%   Visual Acuity Right Eye Distance:   Left Eye Distance:   Bilateral Distance:    Right Eye Near:   Left Eye Near:    Bilateral Near:     Physical Exam Constitutional:      General: She is not in acute distress.    Appearance: Normal appearance. She is not ill-appearing.  Cardiovascular:     Rate and Rhythm: Normal rate and regular rhythm.     Pulses: Normal pulses.     Heart sounds: Normal heart sounds.  Pulmonary:     Effort: Pulmonary effort is normal.     Breath sounds: Normal breath sounds.  Abdominal:     General: Bowel sounds are normal.     Palpations: Abdomen is soft.  Neurological:     Mental Status: She is alert.      UC Treatments / Results  Labs (all labs ordered are listed, but only abnormal results are displayed) Labs Reviewed  CERVICOVAGINAL ANCILLARY ONLY    EKG   Radiology No results found.  Procedures Procedures (including critical care time)  Medications Ordered in UC Medications - No data to display  Initial Impression / Assessment and Plan / UC Course  I have reviewed the triage vital signs and the nursing notes.  Pertinent labs & imaging results that were available during my care of the patient were reviewed by me and considered in my  medical decision making (see chart for details).     1.  Acute vaginitis, likely bacterial vaginosis: Metronidazole 500 mg twice daily for 7 days Cervicovaginal swab for bacterial vaginosis and vaginal yeast Will call patient with recommendations if labs are abnormal Return precautions given. Final Clinical Impressions(s) / UC Diagnoses   Final diagnoses:  Acute vaginitis     Discharge Instructions      This take medications as per scribed Please avoid alcohol intake whilst you are taking the medications because drinking alcohol while you are taking metronidazole will make you very nauseated. Will call you with recommendations if labs are abnormal Return to urgent care if symptoms worsen.   ED Prescriptions     Medication Sig Dispense Auth. Provider   metroNIDAZOLE (FLAGYL) 500 MG tablet Take 1 tablet (500 mg total) by mouth 2 (two) times daily. 14 tablet Loree Shehata, Britta Mccreedy, MD      PDMP not reviewed this encounter.   Merrilee Jansky, MD 03/08/23 [phone]

## 2023-03-09 LAB — CERVICOVAGINAL ANCILLARY ONLY
Bacterial Vaginitis (gardnerella): POSITIVE — AB
Candida Glabrata: NEGATIVE
Candida Vaginitis: NEGATIVE
Comment: NEGATIVE
Comment: NEGATIVE
Comment: NEGATIVE

## 2023-05-10 ENCOUNTER — Ambulatory Visit (HOSPITAL_COMMUNITY): Payer: Self-pay

## 2023-05-22 ENCOUNTER — Encounter (HOSPITAL_COMMUNITY): Payer: Self-pay | Admitting: *Deleted

## 2023-05-22 ENCOUNTER — Ambulatory Visit (HOSPITAL_COMMUNITY)
Admission: EM | Admit: 2023-05-22 | Discharge: 2023-05-22 | Disposition: A | Discharge status: Home / Self Care | Payer: Self-pay | Attending: Emergency Medicine | Admitting: Emergency Medicine

## 2023-05-22 DIAGNOSIS — R109 Unspecified abdominal pain: Secondary | ICD-10-CM

## 2023-05-22 DIAGNOSIS — N898 Other specified noninflammatory disorders of vagina: Secondary | ICD-10-CM

## 2023-05-22 DIAGNOSIS — Z113 Encounter for screening for infections with a predominantly sexual mode of transmission: Secondary | ICD-10-CM

## 2023-05-22 LAB — HIV ANTIBODY (ROUTINE TESTING W REFLEX): HIV Screen 4th Generation wRfx: NONREACTIVE

## 2023-05-22 LAB — POCT URINE PREGNANCY: Preg Test, Ur: NEGATIVE

## 2023-05-22 NOTE — Discharge Instructions (Signed)
Results will come back over the next few days and someone will call you if results are positive and prescribe medication as necessary. You can taking Ibuprofen and Tylenol as needed for abdominal cramping. Follow-up with OBGYN or here if symptoms persist. If you develop severe abdominal pain, vaginal bleeding, or fever please seek immediate medical treatment in ER.

## 2023-05-22 NOTE — ED Triage Notes (Signed)
Pt states she has had vaginal discharge and abdominal cramping X 2 days. She hasn't used any meds.   She would like a pregnancy test states LMP was 04/26/2023

## 2023-05-22 NOTE — ED Provider Notes (Signed)
[facility]    CSN: [id number] Arrival date & time: 05/22/23  1554      History   Chief Complaint Chief Complaint  Patient presents with   Abdominal Pain   Possible Pregnancy   Vaginal Discharge    HPI Andrea Lynch is a 27 y.o. female.   Patient presents with vaginal discharge and abdominal cramping x 2 days. Patient is requesting STD screening and pregnancy test. Denies nausea, vomiting, diarrhea, vaginal bleeding, back pain, and fever.   Abdominal Pain Associated symptoms: dysuria and vaginal discharge   Associated symptoms: no chest pain, no chills, no diarrhea, no fatigue, no fever, no hematuria, no nausea, no vaginal bleeding and no vomiting   Possible Pregnancy Associated symptoms include abdominal pain. Pertinent negatives include no chest pain and no headaches.  Vaginal Discharge Associated symptoms: abdominal pain and dysuria   Associated symptoms: no fever, no nausea and no vomiting     Past Medical History:  Diagnosis Date   HSV (herpes simplex virus) infection    Medical history non-contributory     Patient Active Problem List   Diagnosis Date Noted   Normal labor 03/27/2020   Status post normal vaginal delivery 11/20/2014   Non-reactive NST (non-stress test) 11/18/2014    Past Surgical History:  Procedure Laterality Date   NO PAST SURGERIES      OB History     Gravida  2   Para  2   Term  2   Preterm      AB      Living  2      SAB      IAB      Ectopic      Multiple  0   Live Births  2            Home Medications    Prior to Admission medications   Not on File    Family History Family History  Problem Relation Age of Onset   Hypertension Mother    Hypertension Father    Allergies Sister     Social History Social History   Tobacco Use   Smoking status: Every Day    Types: Cigars   Smokeless tobacco: Never  Vaping Use   Vaping status: Never Used  Substance Use Topics   Alcohol use: No    Drug use: No     Allergies   Azo urinary tract support   Review of Systems Review of Systems  Constitutional:  Negative for chills, fatigue and fever.  Cardiovascular:  Negative for chest pain.  Gastrointestinal:  Positive for abdominal pain. Negative for diarrhea, nausea and vomiting.  Genitourinary:  Positive for dysuria and vaginal discharge. Negative for difficulty urinating, flank pain, hematuria, vaginal bleeding and vaginal pain.  Skin:  Negative for color change.  Neurological:  Negative for dizziness, weakness, light-headedness and headaches.     Physical Exam Triage Vital Signs ED Triage Vitals  Encounter Vitals Group     BP 05/22/23 1725 106/72     Systolic BP Percentile --      Diastolic BP Percentile --      Pulse Rate 05/22/23 1725 64     Resp 05/22/23 1725 18     Temp 05/22/23 1725 98.4 F (36.9 C)     Temp Source 05/22/23 1725 Oral     SpO2 05/22/23 1725 99 %     Weight --      Height --      Head Circumference --  Peak Flow --      Pain Score 05/22/23 1724 4     Pain Loc --      Pain Education --      Exclude from Growth Chart --    No data found.  Updated Vital Signs BP 106/72 (BP Location: Left Arm)   Pulse 64   Temp 98.4 F (36.9 C) (Oral)   Resp 18   LMP 04/26/2023 (Approximate)   SpO2 99%   Visual Acuity Right Eye Distance:   Left Eye Distance:   Bilateral Distance:    Right Eye Near:   Left Eye Near:    Bilateral Near:     Physical Exam Vitals and nursing note reviewed.  Constitutional:      General: She is awake. She is not in acute distress.    Appearance: Normal appearance. She is well-developed and well-groomed. She is not ill-appearing or toxic-appearing.  Cardiovascular:     Rate and Rhythm: Normal rate.     Heart sounds: Normal heart sounds.  Pulmonary:     Effort: Pulmonary effort is normal. No respiratory distress.     Breath sounds: Normal breath sounds. No wheezing.  Abdominal:     General: Abdomen is  flat. Bowel sounds are normal.     Palpations: Abdomen is soft.     Tenderness: There is no abdominal tenderness. There is no right CVA tenderness, left CVA tenderness, guarding or rebound. Negative signs include Murphy's sign, Rovsing's sign and McBurney's sign.     Hernia: No hernia is present.  Genitourinary:    Comments: Exam declined. Skin:    General: Skin is warm and dry.  Neurological:     Mental Status: She is alert.  Psychiatric:        Behavior: Behavior is cooperative.      UC Treatments / Results  Labs (all labs ordered are listed, but only abnormal results are displayed) Labs Reviewed  RPR  HIV ANTIBODY (ROUTINE TESTING W REFLEX)  POCT URINE PREGNANCY  CERVICOVAGINAL ANCILLARY ONLY    EKG   Radiology No results found.  Procedures Procedures (including critical care time)  Medications Ordered in UC Medications - No data to display  Initial Impression / Assessment and Plan / UC Course  I have reviewed the triage vital signs and the nursing notes.  Pertinent labs & imaging results that were available during my care of the patient were reviewed by me and considered in my medical decision making (see chart for details).     Patient presented with vaginal discharge and abdominal cramping x 2 days. Endorses some mild dysuria to outer urethra area. Patient is requesting STD screening and pregnancy test. LMP 04/26/23. Denies nausea, vomiting, diarrhea, vaginal bleeding, back pain, and fever.  No significant findings upon exam. GU exam declined and patient agreed to self swab. Urine pregnancy was negative in clinic.  Informed patient results would return over the next few days and someone would call if they are positive and need to prescribe medication.  Recommended taking ibuprofen and Tylenol as needed for abdominal cramping. Follow-up, return, and emergency department precautions discussed. Final Clinical Impressions(s) / UC Diagnoses   Final diagnoses:  Vaginal  discharge  Abdominal cramping  Screening for STD (sexually transmitted disease)     Discharge Instructions      Results will come back over the next few days and someone will call you if results are positive and prescribe medication as necessary. You can taking Ibuprofen and Tylenol as needed  for abdominal cramping. Follow-up with OBGYN or here if symptoms persist. If you develop severe abdominal pain, vaginal bleeding, or fever please seek immediate medical treatment in ER.     ED Prescriptions   None    PDMP not reviewed this encounter.   Wynonia Lawman A, NP 05/22/23 1827

## 2023-05-23 ENCOUNTER — Telehealth (HOSPITAL_COMMUNITY): Payer: Self-pay | Admitting: Emergency Medicine

## 2023-05-23 LAB — CERVICOVAGINAL ANCILLARY ONLY
Bacterial Vaginitis (gardnerella): POSITIVE — AB
Candida Glabrata: NEGATIVE
Candida Vaginitis: NEGATIVE
Chlamydia: NEGATIVE
Comment: NEGATIVE
Comment: NEGATIVE
Comment: NEGATIVE
Comment: NEGATIVE
Comment: NEGATIVE
Comment: NORMAL
Neisseria Gonorrhea: NEGATIVE
Trichomonas: NEGATIVE

## 2023-05-23 LAB — RPR: RPR Ser Ql: NONREACTIVE

## 2023-05-23 MED ORDER — METRONIDAZOLE 500 MG PO TABS: 500.0000 mg | ORAL_TABLET | Freq: Two times a day (BID) | ORAL | 0 refills | Status: DC

## 2023-05-23 NOTE — Telephone Encounter (Signed)
Metronidazole for positive BV

## 2023-09-01 ENCOUNTER — Ambulatory Visit (HOSPITAL_COMMUNITY)
Admission: EM | Admit: 2023-09-01 | Discharge: 2023-09-01 | Disposition: A | Discharge status: Home / Self Care | Payer: Self-pay | Attending: Family Medicine | Admitting: Family Medicine

## 2023-09-01 ENCOUNTER — Encounter (HOSPITAL_COMMUNITY): Payer: Self-pay

## 2023-09-01 DIAGNOSIS — N760 Acute vaginitis: Secondary | ICD-10-CM | POA: Insufficient documentation

## 2023-09-01 MED ORDER — METRONIDAZOLE 500 MG PO TABS: 500.0000 mg | ORAL_TABLET | Freq: Two times a day (BID) | ORAL | 0 refills | Status: AC

## 2023-09-01 NOTE — ED Provider Notes (Signed)
[facility]    CSN: [id number] Arrival date & time: 09/01/23  1055      History   Chief Complaint Chief Complaint  Patient presents with   Vaginal Discharge    HPI Andrea Lynch is a 27 y.o. female.    Vaginal Discharge Here for vaginal discharge and slight irritation.  No abdominal pain or vomiting or fever.  No dysuria  Last menstrual cycle was December 12.  She has a Nexplanon implant and asks if we can take it out here.  She is not allergic any medication  She has had HIV and RPR testing in September.  Past Medical History:  Diagnosis Date   HSV (herpes simplex virus) infection    Medical history non-contributory     Patient Active Problem List   Diagnosis Date Noted   Normal labor 03/27/2020   Status post normal vaginal delivery 11/20/2014   Non-reactive NST (non-stress test) 11/18/2014    Past Surgical History:  Procedure Laterality Date   NO PAST SURGERIES      OB History     Gravida  2   Para  2   Term  2   Preterm      AB      Living  2      SAB      IAB      Ectopic      Multiple  0   Live Births  2            Home Medications    Prior to Admission medications   Medication Sig Start Date End Date Taking? Authorizing Provider  metroNIDAZOLE (FLAGYL) 500 MG tablet Take 1 tablet (500 mg total) by mouth 2 (two) times daily for 7 days. 09/01/23 09/08/23  Zenia Resides, MD    Family History Family History  Problem Relation Age of Onset   Hypertension Mother    Hypertension Father    Allergies Sister     Social History Social History   Tobacco Use   Smoking status: Every Day    Types: Cigars   Smokeless tobacco: Never  Vaping Use   Vaping status: Never Used  Substance Use Topics   Alcohol use: No   Drug use: No     Allergies   Azo urinary tract support   Review of Systems Review of Systems  Genitourinary:  Positive for vaginal discharge.     Physical Exam Triage Vital Signs ED  Triage Vitals  Encounter Vitals Group     BP 09/01/23 1139 116/76     Systolic BP Percentile --      Diastolic BP Percentile --      Pulse Rate 09/01/23 1139 86     Resp 09/01/23 1139 18     Temp 09/01/23 1139 98.6 F (37 C)     Temp Source 09/01/23 1139 Oral     SpO2 09/01/23 1139 98 %     Weight 09/01/23 1137 200 lb (90.7 kg)     Height 09/01/23 1137 5\' 2"  (1.575 m)     Head Circumference --      Peak Flow --      Pain Score 09/01/23 1137 0     Pain Loc --      Pain Education --      Exclude from Growth Chart --    No data found.  Updated Vital Signs BP 116/76 (BP Location: Right Arm)   Pulse 86   Temp 98.6 F (37 C) (  Oral)   Resp 18   Ht 5\' 2"  (1.575 m)   Wt 90.7 kg   LMP 08/22/2023 (Exact Date)   SpO2 98%   BMI 36.58 kg/m   Visual Acuity Right Eye Distance:   Left Eye Distance:   Bilateral Distance:    Right Eye Near:   Left Eye Near:    Bilateral Near:     Physical Exam Vitals reviewed.  Constitutional:      General: She is not in acute distress.    Appearance: She is not ill-appearing, toxic-appearing or diaphoretic.  Skin:    Coloration: Skin is not pale.  Neurological:     Mental Status: She is alert and oriented to person, place, and time.  Psychiatric:        Behavior: Behavior normal.      UC Treatments / Results  Labs (all labs ordered are listed, but only abnormal results are displayed) Labs Reviewed  CERVICOVAGINAL ANCILLARY ONLY    EKG   Radiology No results found.  Procedures Procedures (including critical care time)  Medications Ordered in UC Medications - No data to display  Initial Impression / Assessment and Plan / UC Course  I have reviewed the triage vital signs and the nursing notes.  Pertinent labs & imaging results that were available during my care of the patient were reviewed by me and considered in my medical decision making (see chart for details).   Vaginal self swab is done, and we will notify of any  positives on that and treat per protocol.  Metronidazole is sent in for empiric treatment for BV. Final Clinical Impressions(s) / UC Diagnoses   Final diagnoses:  Acute vaginitis   Discharge Instructions   None    ED Prescriptions     Medication Sig Dispense Auth. Provider   metroNIDAZOLE (FLAGYL) 500 MG tablet Take 1 tablet (500 mg total) by mouth 2 (two) times daily for 7 days. 14 tablet Liana Camerer, Janace Aris, MD      PDMP not reviewed this encounter.   Zenia Resides, MD 09/01/23 [phone]

## 2023-09-01 NOTE — ED Triage Notes (Addendum)
Pt presents with vaginal discharge x 2 days. Pt currently denies pain. Pt denies taking medications for symptoms reported. Pt denies urinary symptoms.

## 2023-09-01 NOTE — Discharge Instructions (Signed)
Staff will notify you if there is anything positive on the swab  Take metronidazole 500 mg--1 tablet 2 times daily for 7 days.  Avoid drinking alcohol within 72 hours of taking this medication

## 2023-09-04 LAB — CERVICOVAGINAL ANCILLARY ONLY
Bacterial Vaginitis (gardnerella): POSITIVE — AB
Candida Glabrata: NEGATIVE
Candida Vaginitis: NEGATIVE
Chlamydia: NEGATIVE
Comment: NEGATIVE
Comment: NEGATIVE
Comment: NEGATIVE
Comment: NEGATIVE
Comment: NEGATIVE
Comment: NORMAL
Neisseria Gonorrhea: NEGATIVE
Trichomonas: NEGATIVE

## 2023-12-06 ENCOUNTER — Ambulatory Visit (HOSPITAL_COMMUNITY)
Admission: EM | Admit: 2023-12-06 | Discharge: 2023-12-06 | Disposition: A | Discharge status: Home / Self Care | Attending: Physician Assistant | Admitting: Physician Assistant

## 2023-12-06 ENCOUNTER — Encounter (HOSPITAL_COMMUNITY): Payer: Self-pay

## 2023-12-06 DIAGNOSIS — H00024 Hordeolum internum left upper eyelid: Secondary | ICD-10-CM

## 2023-12-06 DIAGNOSIS — H5712 Ocular pain, left eye: Secondary | ICD-10-CM | POA: Diagnosis not present

## 2023-12-06 MED ORDER — CIPROFLOXACIN HCL 0.3 % OP SOLN: 1.0000 [drp] | OPHTHALMIC | 0 refills | Status: AC

## 2023-12-06 NOTE — ED Provider Notes (Signed)
 [facility]    CSN: [id number] Arrival date & time: 12/06/23  1246      History   Chief Complaint Chief Complaint  Patient presents with   Eye Problem    HPI Andrea Lynch is a 28 y.o. female.   HPI  She reports swelling of the left eye for about 3-4 days  She denies foreign body or splashes to eye  Interventions: warm compresses to the area   She does wear contacts, reprots they are monthly ones.She reports wearing contacts daily  She does have glasses      Past Medical History:  Diagnosis Date   HSV (herpes simplex virus) infection    Medical history non-contributory     Patient Active Problem List   Diagnosis Date Noted   Normal labor 03/27/2020   Status post normal vaginal delivery 11/20/2014   Non-reactive NST (non-stress test) 11/18/2014    Past Surgical History:  Procedure Laterality Date   NO PAST SURGERIES      OB History     Gravida  2   Para  2   Term  2   Preterm      AB      Living  2      SAB      IAB      Ectopic      Multiple  0   Live Births  2            Home Medications    Prior to Admission medications   Medication Sig Start Date End Date Taking? Authorizing Provider  ciprofloxacin (CILOXAN) 0.3 % ophthalmic solution Place 1 drop into the left eye every 4 (four) hours while awake for 7 days. Administer 1 drop, every 2 hours, while awake, for 2 days. Then 1 drop, every 4 hours, while awake, for the next 5 days. 12/06/23 12/13/23 Yes Garold Sheeler, Oswaldo Conroy, PA-C    Family History Family History  Problem Relation Age of Onset   Hypertension Mother    Hypertension Father    Allergies Sister     Social History Social History   Tobacco Use   Smoking status: Every Day    Types: Cigars   Smokeless tobacco: Never  Vaping Use   Vaping status: Never Used  Substance Use Topics   Alcohol use: No   Drug use: No     Allergies   Azo urinary tract support   Review of Systems Review of Systems   Constitutional:  Negative for chills and fever.  Eyes:  Positive for pain, discharge and itching. Negative for photophobia, redness and visual disturbance.  Neurological:  Negative for dizziness and headaches.     Physical Exam Triage Vital Signs ED Triage Vitals  Encounter Vitals Group     BP 12/06/23 1352 114/73     Systolic BP Percentile --      Diastolic BP Percentile --      Pulse Rate 12/06/23 1352 67     Resp 12/06/23 1352 18     Temp 12/06/23 1352 98.9 F (37.2 C)     Temp Source 12/06/23 1352 Oral     SpO2 12/06/23 1352 97 %     Weight --      Height --      Head Circumference --      Peak Flow --      Pain Score 12/06/23 1353 7     Pain Loc --      Pain Education --  Exclude from Growth Chart --    No data found.  Updated Vital Signs BP 114/73 (BP Location: Left Arm)   Pulse 67   Temp 98.9 F (37.2 C) (Oral)   Resp 18   LMP 11/19/2023   SpO2 97%   Visual Acuity Right Eye Distance:   Left Eye Distance:   Bilateral Distance:    Right Eye Near:   Left Eye Near:    Bilateral Near:     Physical Exam Vitals reviewed.  Constitutional:      General: She is awake.     Appearance: Normal appearance. She is well-developed and well-groomed.  HENT:     Head: Normocephalic and atraumatic.  Eyes:     General: Gaze aligned appropriately. No allergic shiner or scleral icterus.       Right eye: No foreign body, discharge or hordeolum.        Left eye: Discharge and hordeolum present.No foreign body.     Extraocular Movements: Extraocular movements intact.     Right eye: Normal extraocular motion and no nystagmus.     Left eye: Normal extraocular motion and no nystagmus.     Conjunctiva/sclera:     Right eye: Right conjunctiva is not injected. No chemosis, exudate or hemorrhage.    Left eye: Left conjunctiva is not injected. No chemosis, exudate or hemorrhage.    Pupils: Pupils are equal, round, and reactive to light.  Lymphadenopathy:     Head:      Right side of head: No submental, submandibular or preauricular adenopathy.     Left side of head: No submental, submandibular or preauricular adenopathy.     Cervical:     Right cervical: No superficial cervical adenopathy.    Left cervical: No superficial cervical adenopathy.     Upper Body:     Right upper body: No supraclavicular adenopathy.     Left upper body: No supraclavicular adenopathy.  Skin:    General: Skin is warm and dry.  Neurological:     General: No focal deficit present.     Mental Status: She is alert and oriented to person, place, and time.  Psychiatric:        Mood and Affect: Mood normal.        Behavior: Behavior normal. Behavior is cooperative.        Thought Content: Thought content normal.        Judgment: Judgment normal.      UC Treatments / Results  Labs (all labs ordered are listed, but only abnormal results are displayed) Labs Reviewed - No data to display  EKG   Radiology No results found.  Procedures Procedures (including critical care time)  Medications Ordered in UC Medications - No data to display  Initial Impression / Assessment and Plan / UC Course  I have reviewed the triage vital signs and the nursing notes.  Pertinent labs & imaging results that were available during my care of the patient were reviewed by me and considered in my medical decision making (see chart for details).      Final Clinical Impressions(s) / UC Diagnoses   Final diagnoses:  Hordeolum internum of left upper eyelid  Left eye pain   Patient presents today with concerns for persistent hordeolum of the left upper eyelid.  She reports that symptoms do not appear to be improving with warm compresses at home.  She is a contact lens user.  At this time I am concerned for potential infection given redness  and tenderness of the stye.  Will send in ciprofloxacin ophthalmic drops to help manage this.  Recommend continued use of warm compresses to the eye to help  with resolution.  Can use Tylenol and ibuprofen as needed for pain management.  ED and return precautions were reviewed and provided in after visit summary.  Recommend close follow-up with primary care provider and ophthalmology if symptoms or not improving over the next 2 weeks.  Follow-up as indicated     Discharge Instructions      This time it appears that you likely have a stye on the left upper eyelid.  Recommend that you continue to use warm compresses to the area to help encourage drainage and resolution.  Given the fact that your symptoms seem to be getting worse and you wear contact lenses I am sending in a prescription for an antibiotic eyedrop for you to use every 4 hours for 7 days.  Please use her glasses and refrain from wearing contacts for at least 2 weeks.  I recommend using sterile eye flushes and lubricating eyedrops as needed to help with eye irritation and gritty sensation.  You can use Tylenol and/or ibuprofen as needed for pain management.  If your symptoms do not appear to be improving over the next 2 weeks I recommend following up with an ophthalmologist for further evaluation and potential management. If at any point you start to develop fever, chills, displacement of the eye, vision changes, severe swelling around the eye please go to the emergency room for further evaluation and management as this could be signs of a potential emergency medical situation.     ED Prescriptions     Medication Sig Dispense Auth. Provider   ciprofloxacin (CILOXAN) 0.3 % ophthalmic solution Place 1 drop into the left eye every 4 (four) hours while awake for 7 days. Administer 1 drop, every 2 hours, while awake, for 2 days. Then 1 drop, every 4 hours, while awake, for the next 5 days. 2.5 mL Loucinda Croy E, PA-C      PDMP not reviewed this encounter.   Providence Crosby, PA-C 12/06/23 1549

## 2023-12-06 NOTE — ED Triage Notes (Signed)
 Pt c/o a stye to lt upper eye lid x3 days. States used warm compresses with no relief.

## 2023-12-06 NOTE — Discharge Instructions (Signed)
 This time it appears that you likely have a stye on the left upper eyelid.  Recommend that you continue to use warm compresses to the area to help encourage drainage and resolution.  Given the fact that your symptoms seem to be getting worse and you wear contact lenses I am sending in a prescription for an antibiotic eyedrop for you to use every 4 hours for 7 days.  Please use her glasses and refrain from wearing contacts for at least 2 weeks.  I recommend using sterile eye flushes and lubricating eyedrops as needed to help with eye irritation and gritty sensation.  You can use Tylenol and/or ibuprofen as needed for pain management.  If your symptoms do not appear to be improving over the next 2 weeks I recommend following up with an ophthalmologist for further evaluation and potential management. If at any point you start to develop fever, chills, displacement of the eye, vision changes, severe swelling around the eye please go to the emergency room for further evaluation and management as this could be signs of a potential emergency medical situation.

## 2024-01-15 ENCOUNTER — Encounter (HOSPITAL_COMMUNITY): Payer: Self-pay

## 2024-01-15 ENCOUNTER — Ambulatory Visit (HOSPITAL_COMMUNITY)
Admission: EM | Admit: 2024-01-15 | Discharge: 2024-01-15 | Disposition: A | Discharge status: Home / Self Care | Attending: Family Medicine | Admitting: Family Medicine

## 2024-01-15 DIAGNOSIS — N898 Other specified noninflammatory disorders of vagina: Secondary | ICD-10-CM | POA: Insufficient documentation

## 2024-01-15 LAB — POCT URINALYSIS DIP (MANUAL ENTRY)
Bilirubin, UA: NEGATIVE
Blood, UA: NEGATIVE
Glucose, UA: NEGATIVE mg/dL
Ketones, POC UA: NEGATIVE mg/dL
Leukocytes, UA: NEGATIVE
Nitrite, UA: NEGATIVE
Protein Ur, POC: NEGATIVE mg/dL
Spec Grav, UA: <=1.005 — AB
Urobilinogen, UA: 0.2 U/dL
pH, UA: 5.5

## 2024-01-15 NOTE — Discharge Instructions (Addendum)
  1. Vaginal discharge (Primary) - POC urinalysis dipstick completed in UC shows - Cervicovaginal swab collected in UC and sent to lab for further testing results should be available in 2 to 3 days. -Continue to monitor symptoms for any change in severity if there is any escalation of current symptoms or development of new symptoms follow-up in ER for further evaluation and management.

## 2024-01-15 NOTE — ED Provider Notes (Addendum)
 [facility]  Note:  This document was prepared using Dragon voice recognition software and may include unintentional dictation errors.  MRN: [id number] DOB: February 10, 1996  Subjective:   Andrea Lynch is a 28 y.o. female presenting for lower abdominal pressure and vaginal discharge x 4 days.  Patient denies any dysuria, increased urinary frequency, diarrhea, nausea or vomiting, flank pain.  Patient reports past history of discharge similar to current symptoms when she had bacterial vaginosis.  Patient has taken Tylenol  with no improvement to symptoms.  Patient would like STD testing while in urgent care.  Patient denies any vaginal lesion, vaginal pain, pelvic pain.  No current facility-administered medications for this encounter. No current outpatient medications on file.   Allergies  Allergen Reactions   Azo Urinary Tract Support     Past Medical History:  Diagnosis Date   HSV (herpes simplex virus) infection    Medical history non-contributory      Past Surgical History:  Procedure Laterality Date   NO PAST SURGERIES      Family History  Problem Relation Age of Onset   Hypertension Mother    Hypertension Father    Allergies Sister     Social History   Tobacco Use   Smoking status: Every Day    Types: Cigars   Smokeless tobacco: Never  Vaping Use   Vaping status: Never Used  Substance Use Topics   Alcohol use: No   Drug use: No    ROS Refer to HPI for ROS details.  Objective:   Vitals: BP 112/74   Pulse 68   Temp 98.8 F (37.1 C) (Oral)   Resp 17   LMP 12/17/2023 (Exact Date)   SpO2 93%   Physical Exam Vitals and nursing note reviewed.  Constitutional:      General: She is not in acute distress.    Appearance: She is well-developed. She is not ill-appearing or toxic-appearing.  HENT:     Head: Normocephalic and atraumatic.     Mouth/Throat:     Mouth: Mucous membranes are moist.  Cardiovascular:     Rate and Rhythm: Normal rate.   Pulmonary:     Effort: Pulmonary effort is normal. No respiratory distress.  Abdominal:     Palpations: Abdomen is soft.     Tenderness: There is abdominal tenderness (Suprapubic abdominal pressure with palpation.). There is no right CVA tenderness or left CVA tenderness.  Genitourinary:    Vagina: Vaginal discharge (Patient reports that discharge is within mostly clear, mild foul smell.  Patient reports that presentation is similar to previous bouts of bacterial vaginosis.) present.  Skin:    General: Skin is warm and dry.  Neurological:     General: No focal deficit present.     Mental Status: She is alert and oriented to person, place, and time.  Psychiatric:        Mood and Affect: Mood normal.        Behavior: Behavior normal.     Procedures  Results for orders placed or performed during the hospital encounter of 01/15/24 (from the past 24 hours)  POC urinalysis dipstick     Status: Abnormal   Collection Time: 01/15/24  1:43 PM  Result Value Ref Range   Color, UA light yellow (A) yellow   Clarity, UA clear clear   Glucose, UA negative negative mg/dL   Bilirubin, UA negative negative   Ketones, POC UA negative negative mg/dL   Spec Grav, UA <=0.865 (A) 1.010 - 1.025  Blood, UA negative negative   pH, UA 5.5 5.0 - 8.0   Protein Ur, POC negative negative mg/dL   Urobilinogen, UA 0.2 0.2 or 1.0 E.U./dL   Nitrite, UA Negative Negative   Leukocytes, UA Negative Negative    No results found.   Assessment and Plan :     Discharge Instructions       1. Vaginal discharge (Primary) - POC urinalysis dipstick completed in UC shows - Cervicovaginal swab collected in UC and sent to lab for further testing results should be available in 2 to 3 days. -Continue to monitor symptoms for any change in severity if there is any escalation of current symptoms or development of new symptoms follow-up in ER for further evaluation and management.      Alnita Aybar B Tyreon Frigon    Tremond Shimabukuro, Kaloko B, NP 01/15/24 1358    Rahmah Mccamy B, NP 01/15/24 1400

## 2024-01-15 NOTE — ED Triage Notes (Signed)
 Pt present with c/o abd pain x four days. Denies any abnormal BM's or urine output. C/o vaginal discharge. Home interventions: tylenol 

## 2024-01-16 ENCOUNTER — Telehealth (HOSPITAL_COMMUNITY): Payer: Self-pay

## 2024-01-16 LAB — CERVICOVAGINAL ANCILLARY ONLY
Bacterial Vaginitis (gardnerella): POSITIVE — AB
Candida Glabrata: NEGATIVE
Candida Vaginitis: NEGATIVE
Chlamydia: NEGATIVE
Comment: NEGATIVE
Comment: NEGATIVE
Comment: NEGATIVE
Comment: NEGATIVE
Comment: NEGATIVE
Comment: NORMAL
Neisseria Gonorrhea: NEGATIVE
Trichomonas: NEGATIVE

## 2024-01-18 MED ORDER — METRONIDAZOLE 500 MG PO TABS: 500.0000 mg | ORAL_TABLET | Freq: Two times a day (BID) | ORAL | 0 refills | Status: AC

## 2024-01-18 NOTE — Telephone Encounter (Signed)
Per protocol, pt requires tx with metronidazole. Attempted to reach patient x1. Rx sent to pharmacy on file.

## 2024-02-01 ENCOUNTER — Telehealth (HOSPITAL_COMMUNITY): Payer: Self-pay | Admitting: *Deleted

## 2024-02-01 MED ORDER — FLUCONAZOLE 150 MG PO TABS
150.0000 mg | ORAL_TABLET | Freq: Every day | ORAL | 0 refills | Status: AC
Start: 2024-02-01 — End: ?

## 2024-02-01 NOTE — Telephone Encounter (Signed)
 Pts cyto came back she tested positive for BV pt would like to be treated.

## 2024-02-01 NOTE — Telephone Encounter (Signed)
 Pt aware rx sent for possible yeast infection.

## 2024-02-20 ENCOUNTER — Encounter (HOSPITAL_COMMUNITY): Payer: Self-pay

## 2024-02-20 ENCOUNTER — Ambulatory Visit (HOSPITAL_COMMUNITY)
Admission: EM | Admit: 2024-02-20 | Discharge: 2024-02-20 | Disposition: A | Discharge status: Home / Self Care | Attending: Physician Assistant | Admitting: Physician Assistant

## 2024-02-20 DIAGNOSIS — L0291 Cutaneous abscess, unspecified: Secondary | ICD-10-CM

## 2024-02-20 MED ORDER — DOXYCYCLINE HYCLATE 100 MG PO CAPS: 100.0000 mg | ORAL_CAPSULE | Freq: Two times a day (BID) | ORAL | 0 refills | Status: AC

## 2024-02-20 NOTE — ED Triage Notes (Signed)
 Pt present with a vaginal abscess x four days. Pt states it is draining.

## 2024-02-20 NOTE — ED Notes (Signed)
 Pt was on the phone during part of triage

## 2024-02-20 NOTE — ED Provider Notes (Signed)
 [facility]    CSN: [id number] Arrival date & time: 02/20/24  1415      History   Chief Complaint Chief Complaint  Patient presents with   Abscess    HPI Andrea Lynch is a 28 y.o. female.   Patient here today for evaluation of an abscess to her left inner leg/groin area that she first noticed 4 days ago.  She states that the area is draining now.  She continues to have pain in the area.  She has not had fever.  She denies chance of pregnancy at this time.  The history is provided by the patient.    Past Medical History:  Diagnosis Date   HSV (herpes simplex virus) infection    Medical history non-contributory     Patient Active Problem List   Diagnosis Date Noted   Normal labor 03/27/2020   Status post normal vaginal delivery 11/20/2014   Non-reactive NST (non-stress test) 11/18/2014    Past Surgical History:  Procedure Laterality Date   NO PAST SURGERIES      OB History     Gravida  2   Para  2   Term  2   Preterm      AB      Living  2      SAB      IAB      Ectopic      Multiple  0   Live Births  2            Home Medications    Prior to Admission medications   Medication Sig Start Date End Date Taking? Authorizing Provider  doxycycline  (VIBRAMYCIN ) 100 MG capsule Take 1 capsule (100 mg total) by mouth 2 (two) times daily for 7 days. 02/20/24 02/27/24 Yes Vernestine Gondola, PA-C  fluconazole  (DIFLUCAN ) 150 MG tablet Take 1 tablet (150 mg total) by mouth daily. 02/01/24   Harlow Lighter, [place]  N, FNP    Family History Family History  Problem Relation Age of Onset   Hypertension Mother    Hypertension Father    Allergies Sister     Social History Social History   Tobacco Use   Smoking status: Every Day    Types: Cigars   Smokeless tobacco: Never  Vaping Use   Vaping status: Never Used  Substance Use Topics   Alcohol use: No   Drug use: No     Allergies   Azo urinary tract support   Review of  Systems Review of Systems  Constitutional:  Negative for chills and fever.  Eyes:  Negative for discharge and redness.  Respiratory:  Negative for shortness of breath.   Gastrointestinal:  Negative for abdominal pain, nausea and vomiting.  Skin:  Positive for color change and wound.     Physical Exam Triage Vital Signs ED Triage Vitals  Encounter Vitals Group     BP      Systolic BP Percentile      Diastolic BP Percentile      Pulse      Resp      Temp      Temp src      SpO2      Weight      Height      Head Circumference      Peak Flow      Pain Score      Pain Loc      Pain Education      Exclude from Growth Chart  No data found.  Updated Vital Signs BP 115/77 (BP Location: Left Arm)   Pulse 80   Resp 20   LMP  (LMP Unknown)   SpO2 92%   Visual Acuity Right Eye Distance:   Left Eye Distance:   Bilateral Distance:    Right Eye Near:   Left Eye Near:    Bilateral Near:     Physical Exam Vitals and nursing note reviewed.  Constitutional:      General: She is not in acute distress.    Appearance: Normal appearance. She is not ill-appearing.  HENT:     Head: Normocephalic and atraumatic.  Eyes:     Conjunctiva/sclera: Conjunctivae normal.  Cardiovascular:     Rate and Rhythm: Normal rate.  Pulmonary:     Effort: Pulmonary effort is normal. No respiratory distress.  Skin:    Comments: Approx 3 cm area of induration with mild purulent drainage noted to central wound to left groin area  Neurological:     Mental Status: She is alert.  Psychiatric:        Mood and Affect: Mood normal.        Behavior: Behavior normal.        Thought Content: Thought content normal.      UC Treatments / Results  Labs (all labs ordered are listed, but only abnormal results are displayed) Labs Reviewed - No data to display  EKG   Radiology No results found.  Procedures Procedures (including critical care time)  Medications Ordered in UC Medications - No  data to display  Initial Impression / Assessment and Plan / UC Course  I have reviewed the triage vital signs and the nursing notes.  Pertinent labs & imaging results that were available during my care of the patient were reviewed by me and considered in my medical decision making (see chart for details).    Given spontaneous drainage no indication for incision and drainage.  Recommended warm compresses and sitz bath's and antibiotic prescribed.  Encouraged follow-up if no gradual improvement or with any worsening symptoms.  Final Clinical Impressions(s) / UC Diagnoses   Final diagnoses:  Abscess   Discharge Instructions   None    ED Prescriptions     Medication Sig Dispense Auth. Provider   doxycycline  (VIBRAMYCIN ) 100 MG capsule Take 1 capsule (100 mg total) by mouth 2 (two) times daily for 7 days. 14 capsule Vernestine Gondola, PA-C      PDMP not reviewed this encounter.   Vernestine Gondola, PA-C 02/20/24 1554

## 2024-06-12 ENCOUNTER — Ambulatory Visit: Admitting: Family Medicine
